# Patient Record
Sex: Female | Born: 1935 | Race: White | Hispanic: No | Smoking: Former smoker
Health system: Southern US, Community
[De-identification: ages and names within clinical notes are randomized; demographics above are authoritative.]

---

## 1999-11-08 ENCOUNTER — Other Ambulatory Visit: Admission: RE | Admit: 1999-11-08 | Discharge: 1999-11-08 | Payer: Self-pay | Admitting: Internal Medicine

## 2008-05-12 ENCOUNTER — Emergency Department (HOSPITAL_COMMUNITY): Admission: EM | Admit: 2008-05-12 | Discharge: 2008-05-12 | Payer: Self-pay | Admitting: *Deleted

## 2010-03-18 ENCOUNTER — Inpatient Hospital Stay (HOSPITAL_COMMUNITY): Admission: RE | Admit: 2010-03-18 | Discharge: 2010-03-21 | Payer: Self-pay | Admitting: Orthopaedic Surgery

## 2010-07-02 LAB — CBC
HCT: 38.4 % (ref 36.0–46.0)
Hemoglobin: 12.8 g/dL (ref 12.0–15.0)
Hemoglobin: 9.6 g/dL — ABNORMAL LOW (ref 12.0–15.0)
MCV: 89.1 fL (ref 78.0–100.0)
Platelets: 245 10*3/uL (ref 150–400)
Platelets: 253 10*3/uL (ref 150–400)
RBC: 3.29 MIL/uL — ABNORMAL LOW (ref 3.87–5.11)
RBC: 3.31 MIL/uL — ABNORMAL LOW (ref 3.87–5.11)
RDW: 13 % (ref 11.5–15.5)
WBC: 7.3 10*3/uL (ref 4.0–10.5)
WBC: 7.8 10*3/uL (ref 4.0–10.5)
WBC: 9.5 10*3/uL (ref 4.0–10.5)

## 2010-07-02 LAB — BASIC METABOLIC PANEL
Calcium: 8.7 mg/dL (ref 8.4–10.5)
Chloride: 106 mEq/L (ref 96–112)
Creatinine, Ser: 0.82 mg/dL (ref 0.4–1.2)
Creatinine, Ser: 0.93 mg/dL (ref 0.4–1.2)
GFR calc Af Amer: >60 mL/min (ref >60)
GFR calc Af Amer: >60 mL/min (ref >60)
GFR calc non Af Amer: 59 mL/min — ABNORMAL LOW (ref >60)
GFR calc non Af Amer: >60 mL/min (ref >60)
Sodium: 137 mEq/L (ref 135–145)

## 2010-07-02 LAB — URINALYSIS, ROUTINE W REFLEX MICROSCOPIC
Glucose, UA: NEGATIVE mg/dL
Hgb urine dipstick: NEGATIVE
Ketones, ur: NEGATIVE mg/dL
Protein, ur: NEGATIVE mg/dL
pH: 6 (ref 5.0–8.0)

## 2010-07-02 LAB — DIFFERENTIAL
Basophils Relative: 0 % (ref 0–1)
Eosinophils Absolute: 0.2 10*3/uL (ref 0.0–0.7)
Neutrophils Relative %: 61 % (ref 43–77)

## 2010-07-02 LAB — COMPREHENSIVE METABOLIC PANEL
ALT: 17 U/L (ref 0–35)
Alkaline Phosphatase: 84 U/L (ref 39–117)
CO2: 29 mEq/L (ref 19–32)
Chloride: 106 mEq/L (ref 96–112)
GFR calc non Af Amer: 58 mL/min — ABNORMAL LOW (ref >60)
Glucose, Bld: 97 mg/dL (ref 70–99)
Potassium: 3.9 mEq/L (ref 3.5–5.1)
Sodium: 141 mEq/L (ref 135–145)

## 2010-07-02 LAB — PROTIME-INR
INR: 0.92 (ref 0.00–1.49)
INR: 1.1 (ref 0.00–1.49)
INR: 1.34 (ref 0.00–1.49)
INR: 1.61 — ABNORMAL HIGH (ref 0.00–1.49)
Prothrombin Time: 12.6 seconds (ref 11.6–15.2)
Prothrombin Time: 14.4 seconds (ref 11.6–15.2)
Prothrombin Time: 16.8 seconds — ABNORMAL HIGH (ref 11.6–15.2)

## 2010-07-02 LAB — TYPE AND SCREEN: ABO/RH(D): A POS

## 2010-07-02 LAB — ABO/RH: ABO/RH(D): A POS

## 2010-07-05 ENCOUNTER — Encounter (HOSPITAL_COMMUNITY)
Admission: RE | Admit: 2010-07-05 | Discharge: 2010-07-05 | Disposition: A | Discharge status: Home / Self Care | Payer: Medicare Other | Source: Ambulatory Visit | Attending: Orthopaedic Surgery | Admitting: Orthopaedic Surgery

## 2010-07-05 LAB — URINE MICROSCOPIC-ADD ON

## 2010-07-05 LAB — URINALYSIS, ROUTINE W REFLEX MICROSCOPIC
Glucose, UA: NEGATIVE mg/dL
Hgb urine dipstick: NEGATIVE
Protein, ur: NEGATIVE mg/dL
Specific Gravity, Urine: 1.008 (ref 1.005–1.030)
Urobilinogen, UA: 0.2 mg/dL (ref 0.0–1.0)

## 2010-07-05 LAB — DIFFERENTIAL
Basophils Absolute: 0 10*3/uL (ref 0.0–0.1)
Basophils Relative: 0 % (ref 0–1)
Lymphocytes Relative: 31 % (ref 12–46)
Monocytes Absolute: 0.5 10*3/uL (ref 0.1–1.0)
Neutro Abs: 4.8 10*3/uL (ref 1.7–7.7)

## 2010-07-05 LAB — COMPREHENSIVE METABOLIC PANEL
CO2: 28 mEq/L (ref 19–32)
Calcium: 9.8 mg/dL (ref 8.4–10.5)
Creatinine, Ser: 1.02 mg/dL (ref 0.4–1.2)
GFR calc non Af Amer: 53 mL/min — ABNORMAL LOW (ref >60)
Glucose, Bld: 97 mg/dL (ref 70–99)
Sodium: 139 mEq/L (ref 135–145)
Total Protein: 7 g/dL (ref 6.0–8.3)

## 2010-07-05 LAB — CBC
HCT: 38.8 % (ref 36.0–46.0)
Hemoglobin: 12.8 g/dL (ref 12.0–15.0)
MCHC: 33 g/dL (ref 30.0–36.0)
RDW: 13.3 % (ref 11.5–15.5)
WBC: 7.9 10*3/uL (ref 4.0–10.5)

## 2010-07-05 LAB — SURGICAL PCR SCREEN
MRSA, PCR: NEGATIVE
Staphylococcus aureus: NEGATIVE

## 2010-07-08 ENCOUNTER — Inpatient Hospital Stay (HOSPITAL_COMMUNITY): Payer: Medicare Other

## 2010-07-08 ENCOUNTER — Inpatient Hospital Stay (HOSPITAL_COMMUNITY)
Admission: RE | Admit: 2010-07-08 | Discharge: 2010-07-11 | DRG: 470 | Disposition: A | Discharge status: Home Health | Payer: Medicare Other | Source: Ambulatory Visit | Attending: Orthopaedic Surgery | Admitting: Orthopaedic Surgery

## 2010-07-08 DIAGNOSIS — M1710 Unilateral primary osteoarthritis, unspecified knee: Principal | ICD-10-CM | POA: Diagnosis present

## 2010-07-08 DIAGNOSIS — I10 Essential (primary) hypertension: Secondary | ICD-10-CM | POA: Diagnosis present

## 2010-07-08 LAB — TYPE AND SCREEN: ABO/RH(D): A POS

## 2010-07-08 LAB — PROTIME-INR
INR: 1 (ref 0.00–1.49)
Prothrombin Time: 13.4 seconds (ref 11.6–15.2)

## 2010-07-09 LAB — CBC
HCT: 30.3 % — ABNORMAL LOW (ref 36.0–46.0)
MCV: 83.9 fL (ref 78.0–100.0)
Platelets: 269 10*3/uL (ref 150–400)
RBC: 3.61 MIL/uL — ABNORMAL LOW (ref 3.87–5.11)
WBC: 10 10*3/uL (ref 4.0–10.5)

## 2010-07-09 LAB — BASIC METABOLIC PANEL
BUN: 15 mg/dL (ref 6–23)
Chloride: 104 mEq/L (ref 96–112)
Potassium: 4.1 mEq/L (ref 3.5–5.1)

## 2010-07-10 LAB — BASIC METABOLIC PANEL
CO2: 25 mEq/L (ref 19–32)
Chloride: 109 mEq/L (ref 96–112)
GFR calc Af Amer: >60 mL/min (ref >60)
Sodium: 139 mEq/L (ref 135–145)

## 2010-07-10 LAB — CBC
HCT: 25.2 % — ABNORMAL LOW (ref 36.0–46.0)
MCV: 84.6 fL (ref 78.0–100.0)
RBC: 2.98 MIL/uL — ABNORMAL LOW (ref 3.87–5.11)
RDW: 13.8 % (ref 11.5–15.5)
WBC: 11.2 10*3/uL — ABNORMAL HIGH (ref 4.0–10.5)

## 2010-07-11 LAB — HEMOGLOBIN AND HEMATOCRIT, BLOOD: HCT: 27.8 % — ABNORMAL LOW (ref 36.0–46.0)

## 2010-07-11 LAB — PROTIME-INR
INR: 1.47 (ref 0.00–1.49)
Prothrombin Time: 18 seconds — ABNORMAL HIGH (ref 11.6–15.2)

## 2010-07-16 NOTE — Op Note (Signed)
[name], [name]               ACCOUNT NO.:  [id number]  MEDICAL RECORD NO.:  [id number]           PATIENT TYPE:  I  LOCATION:  5014                         FACILITY:  [facility]  PHYSICIAN:  Arnol Mcgibbon C. Ophelia Charter, M.D.    DATE OF BIRTH:  01-27-1932  DATE OF PROCEDURE:  07/08/2010 DATE OF DISCHARGE:                              OPERATIVE REPORT   PREOPERATIVE DIAGNOSIS:  Left knee osteoarthritis.  POSTOPERATIVE DIAGNOSIS:  Left knee osteoarthritis.  PROCEDURE:  Left cemented total knee arthroplasty, computer assisted.  SURGEON:  Arhum Peeples C. Ophelia Charter, MD.  ASSISTANT:  Maud Deed, PA-C, present for the entire procedure and medically necessary.  ANESTHESIA:  GOT plus Marcaine local and preoperative femoral nerve block.  COMPONENTS USED:  DePuy, rotating platform, [employer], cemented, #3 femur with lugs, #3 tibia, and 10-mm spacer, 35-mm all-poly 3-peg patella.  DESCRIPTION OF PROCEDURE:  After induction of general anesthesia, standard prepping and draping, preoperative Ancef prophylaxis, proximal thigh tourniquet, sterile skin marker for anterior midline incision, and Betadine Steri-Drape application.  Time-out procedure was completed. Leg was wrapped in an Esmarch.  Tourniquet inflated.  The patient had a 16-degree flexion contracture and 10-degree varus deformity with severe erosive osteoarthritis with 1-2 cm marginal osteophytes and more medial in the lateral compartment wear and flattening of the femoral condyle, and erosion on the medial tibial plateau.  Medial parapatellar incision was made through the deep retinaculum.  Quad tendon was split between the medial one-third and lateral two-thirds of the quad tendon.  There was some chronic synovitis changes, no purulence.  Large 1-2 cm spurs removed from opposites of the femur.  There was significant overhang of the spurs, eburnated bone, erosive wear.  There was actually some menisci fragments left.  The medial meniscus and  lateral meniscus looked relatively normal.  Meniscus was resected.  Pins were placed in the femur for computer assist.  Half bicortical pins were placed in the femur inside the incision.  Stab incision was made spreading with tonsil clamp and then placement of tibial pins in the mid tibia.  Connectors were attached, tightened down securely, and appropriately adjusted, and knee flexion showed 16.4-degree flexion contracture and 9-degree varus deformity.  Hip rotation was marked followed by degeneration of the tibial model then the femoral model.  A #3 size was recommended on the femur.  This was identical to the opposite side and 10-mm were cut off the femur.  Chamfer cuts were made.  Box cut was made after the tibia was cut.  Tibia cut had significantly more bone removed lateral to medial to the erosive medial wear and 11 mm was taken off the tibia. Some MCL release had been made off the tibia, deep fibers, and large posterior spurs removed off the back of the femur, and the posterior capsule was stripped to allow full extension.  Keel preparation was made in the tibia followed by insertion of trials.  Additional posterior capsule release had been made and spurs removed off the back of the femur, three-quarter curved osteotome.  This gave excellent correction. The knee came out fully straight, less than a millimeter of  difference in balance, medial and lateral, and slightly more medial release was made.  Good collateral balance of flexion and extension.  Pulsatile lavage while the cement was vacuum mixed.  Tibia was cemented first followed by cementing the femur.  Patella had been prepared at the end of the case, removing 10 mm of bone from facet-to-facet, and then drilling for 35 peg hole patella.  Tibia cemented first followed by cementing the femur, removing all excessive cement, insertion of 10-mm all-poly spacer, and then the patella was cemented.  Cement was hard at 15 minutes.   Once cement was hard, knee was taken through full range of motion for final checking, balance, alignment, and everything looked good.  The computer pins were removed.  Repeat irrigation, dropping the tourniquet, hemostasis obtained, and then standard layered closure. Deep layer, nonabsorbable, 0 Vicryl, and superficial retinaculum 2-0 and subcutaneous tissue and subcuticular skin closure.  Postop dressing. Knee immobilizer applied.  The patient tolerated the procedure well and transferred to recovery room in stable condition.     Amiel Mccaffrey C. Ophelia Charter, M.D.     MCY/MEDQ  D:  07/08/2010  T:  07/09/2010  Job:  [id number]  Electronically Signed by Annell Greening M.D. on 07/16/2010 06:01:27 PM

## 2010-08-05 LAB — CBC
RBC: 4.21 MIL/uL (ref 3.87–5.11)
WBC: 7.8 10*3/uL (ref 4.0–10.5)

## 2010-08-08 NOTE — Discharge Summary (Signed)
[name], [name]               ACCOUNT NO.:  [id number]  MEDICAL RECORD NO.:  [id number]           PATIENT TYPE:  I  LOCATION:  5014                         FACILITY:  [facility]  PHYSICIAN:  Evaan Tidwell C. Ophelia Charter, M.D.    DATE OF BIRTH:  May 22, 1931  DATE OF ADMISSION:  07/08/2010 DATE OF DISCHARGE:  07/11/2010                              DISCHARGE SUMMARY   ADMISSION DIAGNOSES: 1. Left knee osteoarthritis. 2. Hypertension. 3. Cataracts.  DISCHARGE DIAGNOSES: 1. Left knee osteoarthritis. 2. Hypertension. 3. Cataracts.  PROCEDURE:  On July 08, 2010, the patient underwent left cemented total knee arthroplasty, computer assisted performed by Dr. Ophelia Charter, assisted by Maud Deed, PA-C under general anesthesia.  CONSULTATIONS:  None.  BRIEF HISTORY:  The patient is a 77 year old female with left knee osteoarthritis that has failed conservative treatment.  It was felt she would benefit from surgical intervention and was admitted for the procedure as stated above.  BRIEF HOSPITAL COURSE:  The patient tolerated the procedure under general anesthesia without complications.  Postoperatively, neurovascular motor function of the lower extremities was noted to be intact.  The patient was treated with IV analgesics initially and weaned to p.o. analgesics without difficulty.  Dressing changes were done daily, and her wound was healing well.  Physical therapy was initiated for ambulation and gait training, range of motion stretching, and strengthening exercises.  CPM was utilized.  The patient was able to ambulate with the physical therapist in the hallway prior to discharge, and she was independent with activities of daily living.  The patient was on Coumadin for DVT prophylaxis.  Adjustments in Coumadin dose made according to daily pro times.  On July 11, 2010, she was stable for discharge to her home.  INR at discharge 1.47, hemoglobin, hematocrit 9.2 and 27.8.  She was afebrile, and  vital signs were stable.  The patient was taking regular diet at the time of discharge.  PLAN:  The patient was discharged to her home after arrangements made for advanced home care to provide home health physical therapy as well as durable medical equipment, and Coumadin management.  She will continue to be weightbearing as tolerated.  She will continue with range of motion stretching and strengthening exercises at home.  MEDICATIONS AT DISCHARGE: 1. Coumadin daily for 4 weeks. 2. Robaxin 500 mg 1 every 6-8 hours as needed for spasm. 3. Percocet 5/325 one every 4-6 hours as needed for pain. 4. She will continue on her usual medications including amlodipine,     losartan, metoprolol, and multivitamin.  She has been instructed to     stop her enteric-coated aspirin.   The patient will follow up with     Dr. Ophelia Charter in 2 weeks from the date of surgery.  She will change her     dressing daily or as needed.  She will be allowed to shower on the     fourth postoperative day if there is no wound drainage.  CPM will     be utilized at home for passive range of motion.  She is advised to     call the office if  she has questions or concerns prior to return     office visit.  CONDITION ON DISCHARGE:  Stable.     Wende Neighbors, P.A.   ______________________________ Veverly Fells Ophelia Charter, M.D.    SMV/MEDQ  D:  08/06/2010  T:  08/07/2010  Job:  [id number]  Electronically Signed by Maud Deed P.A. on 08/07/2010 09:42:26 AM Electronically Signed by Annell Greening M.D. on 08/08/2010 01:13:38 PM

## 2011-04-07 IMAGING — CR DG CHEST 2V
2 series · 2 of 2 positions shown · non-contrast
Comparison: None.

CLINICAL DATA: Preoperative film.

CHEST - 2 VIEW

[view not recorded (1 of 2)]
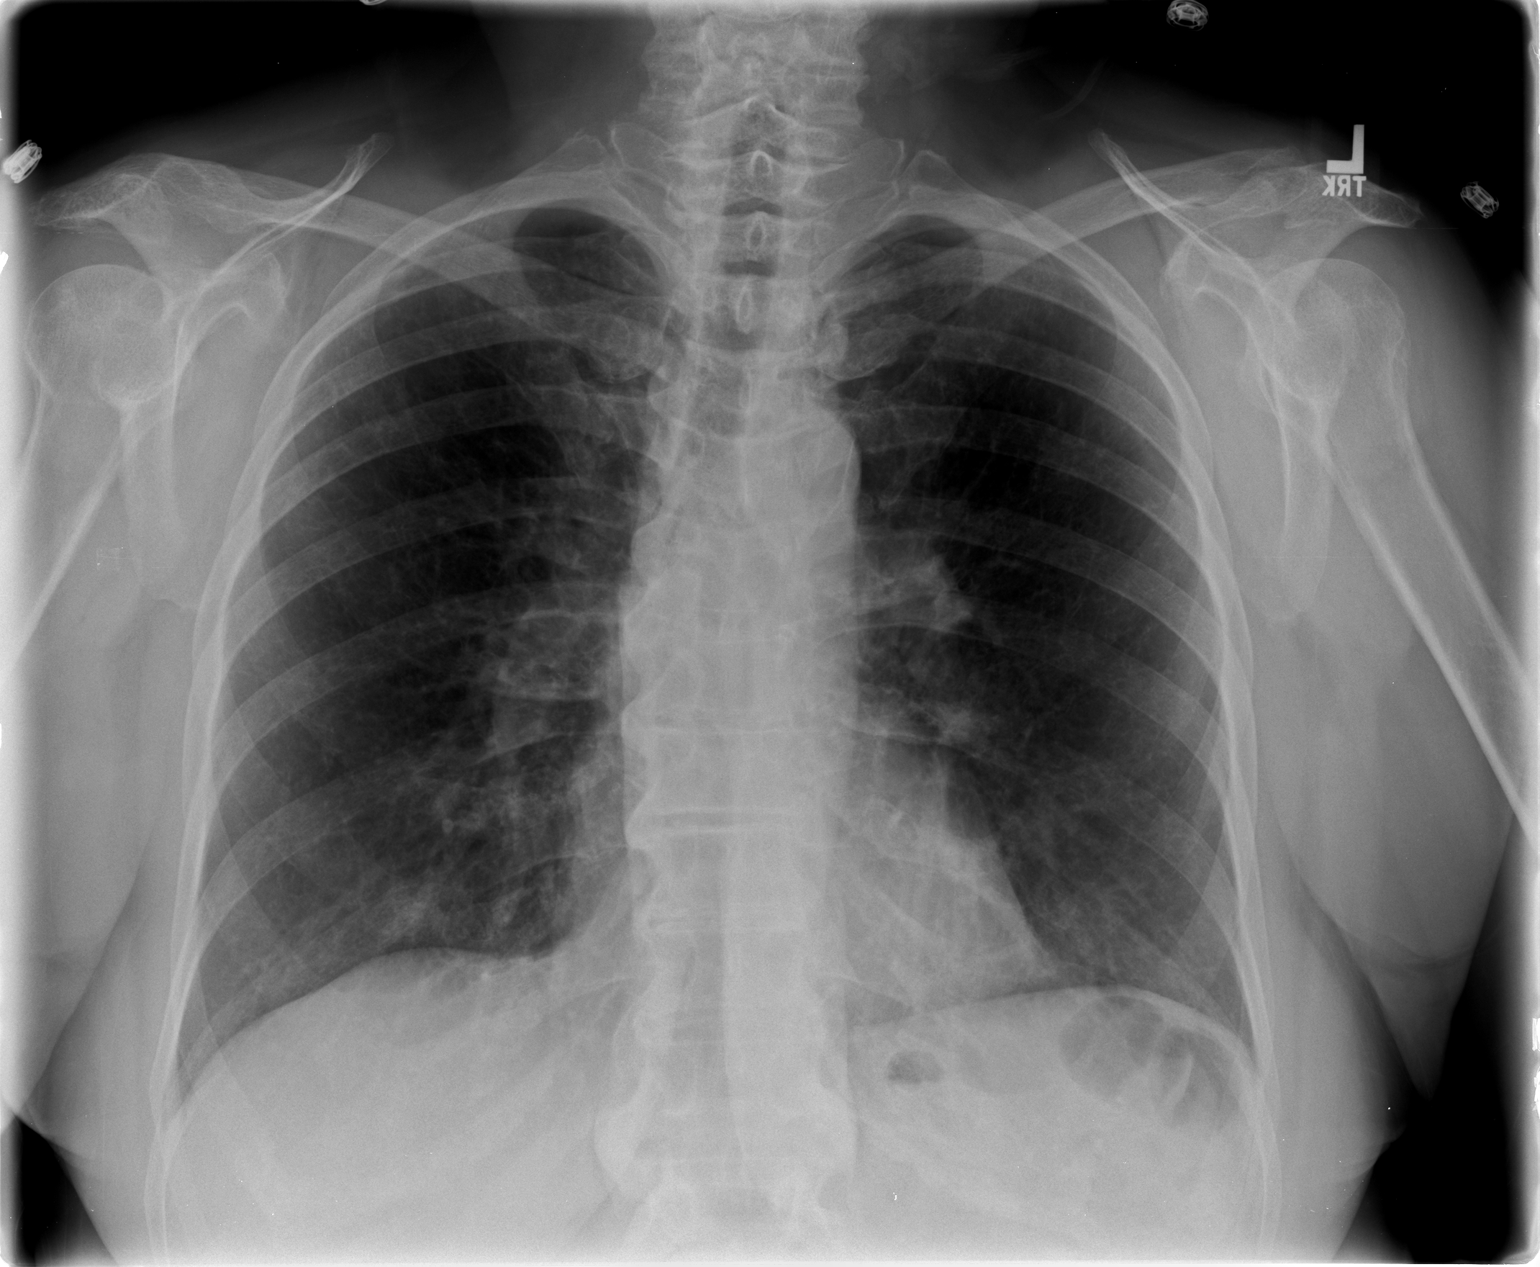

[view not recorded (2 of 2)]
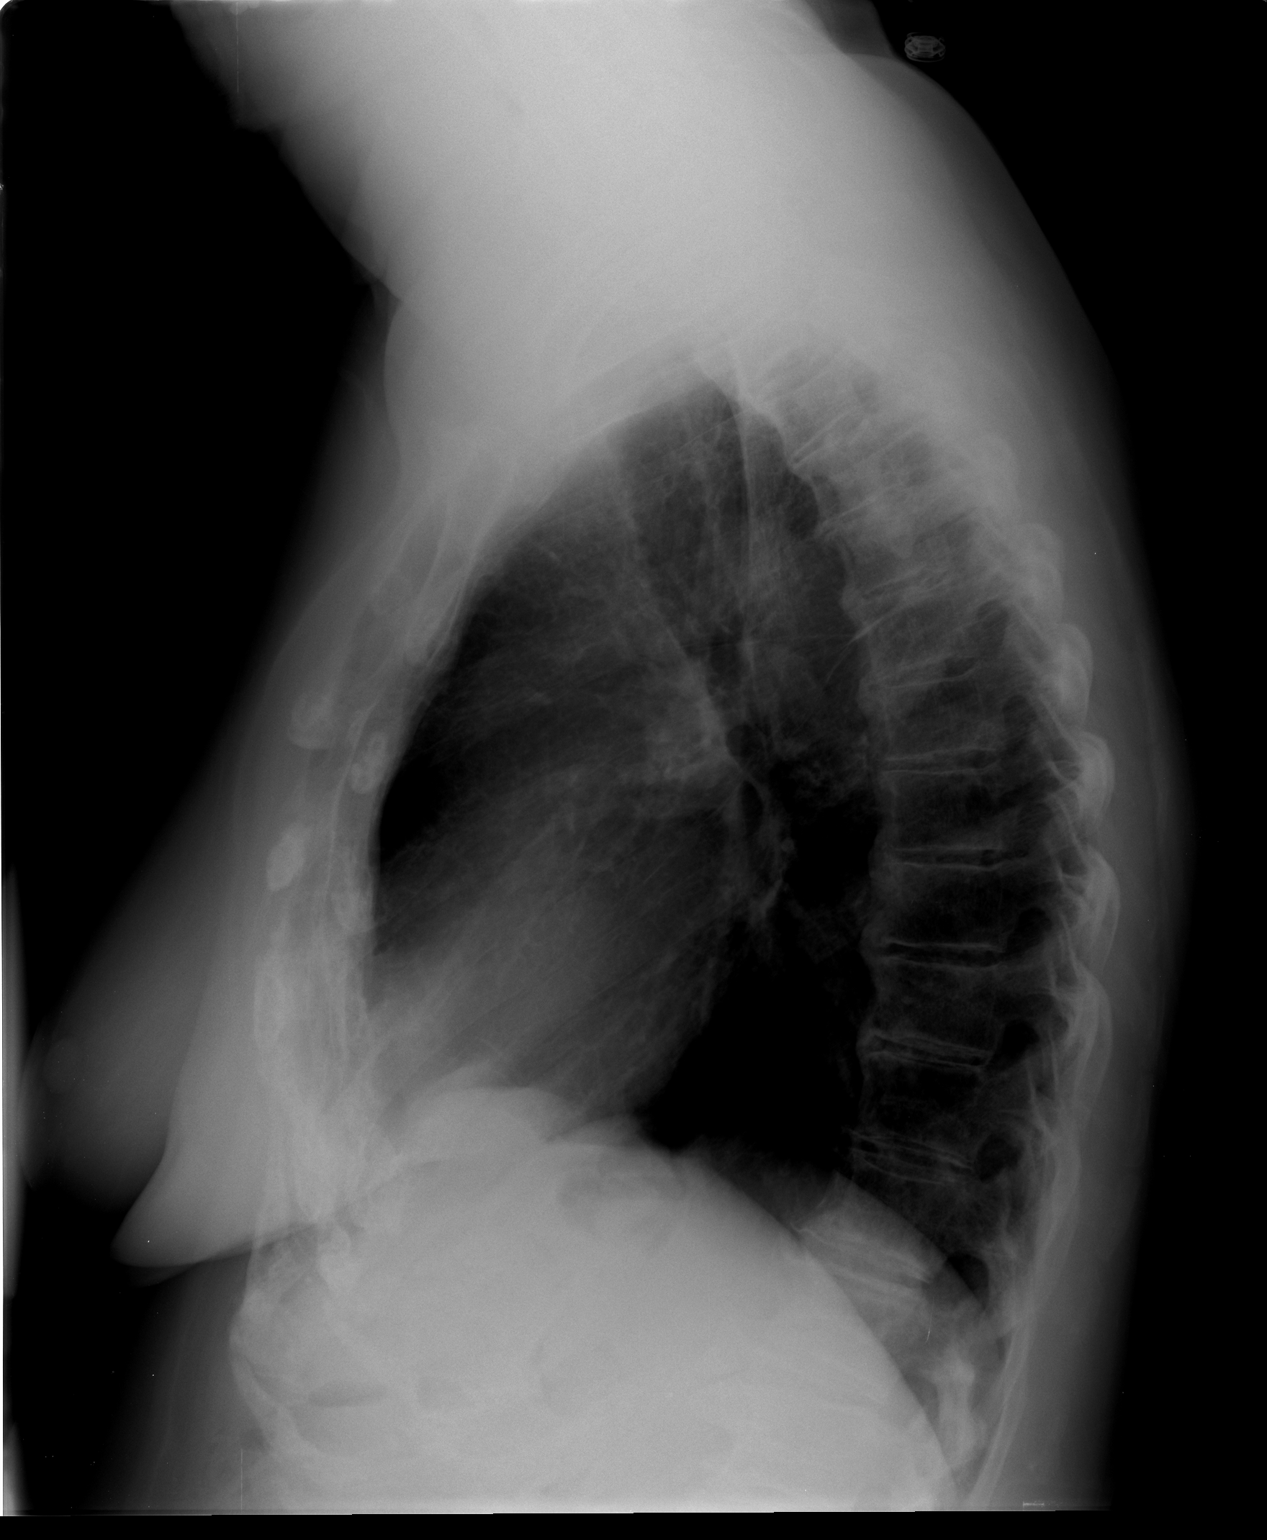

[2 of 2 positions shown; findings below may reference images not displayed]

FINDINGS: Lungs are clear.  Heart size is normal.  No pleural
effusion or pneumothorax.
IMPRESSION: No acute disease.

## 2011-06-25 DIAGNOSIS — Z961 Presence of intraocular lens: Secondary | ICD-10-CM | POA: Diagnosis not present

## 2011-06-25 DIAGNOSIS — H209 Unspecified iridocyclitis: Secondary | ICD-10-CM | POA: Diagnosis not present

## 2011-07-15 DIAGNOSIS — H3530 Unspecified macular degeneration: Secondary | ICD-10-CM | POA: Diagnosis not present

## 2011-07-15 DIAGNOSIS — H2010 Chronic iridocyclitis, unspecified eye: Secondary | ICD-10-CM | POA: Diagnosis not present

## 2011-07-22 DIAGNOSIS — E785 Hyperlipidemia, unspecified: Secondary | ICD-10-CM | POA: Diagnosis not present

## 2011-07-22 DIAGNOSIS — I10 Essential (primary) hypertension: Secondary | ICD-10-CM | POA: Diagnosis not present

## 2011-07-22 DIAGNOSIS — R82998 Other abnormal findings in urine: Secondary | ICD-10-CM | POA: Diagnosis not present

## 2011-07-28 DIAGNOSIS — I10 Essential (primary) hypertension: Secondary | ICD-10-CM | POA: Diagnosis not present

## 2011-07-28 DIAGNOSIS — Z79899 Other long term (current) drug therapy: Secondary | ICD-10-CM | POA: Diagnosis not present

## 2011-07-28 DIAGNOSIS — Z0000 Encounter for general adult medical examination without abnormal findings: Secondary | ICD-10-CM | POA: Diagnosis not present

## 2011-07-28 DIAGNOSIS — E785 Hyperlipidemia, unspecified: Secondary | ICD-10-CM | POA: Diagnosis not present

## 2011-07-30 DIAGNOSIS — Z1212 Encounter for screening for malignant neoplasm of rectum: Secondary | ICD-10-CM | POA: Diagnosis not present

## 2011-07-31 DIAGNOSIS — H3530 Unspecified macular degeneration: Secondary | ICD-10-CM | POA: Diagnosis not present

## 2011-07-31 DIAGNOSIS — H2010 Chronic iridocyclitis, unspecified eye: Secondary | ICD-10-CM | POA: Diagnosis not present

## 2011-08-03 IMAGING — CR DG KNEE 1-2V PORT*L*
2 series · 2 of 2 positions shown · non-contrast
Comparison: None

CLINICAL DATA: Left knee arthroplasty.

PORTABLE LEFT KNEE - 1-2 VIEW

[view not recorded (1 of 2)]
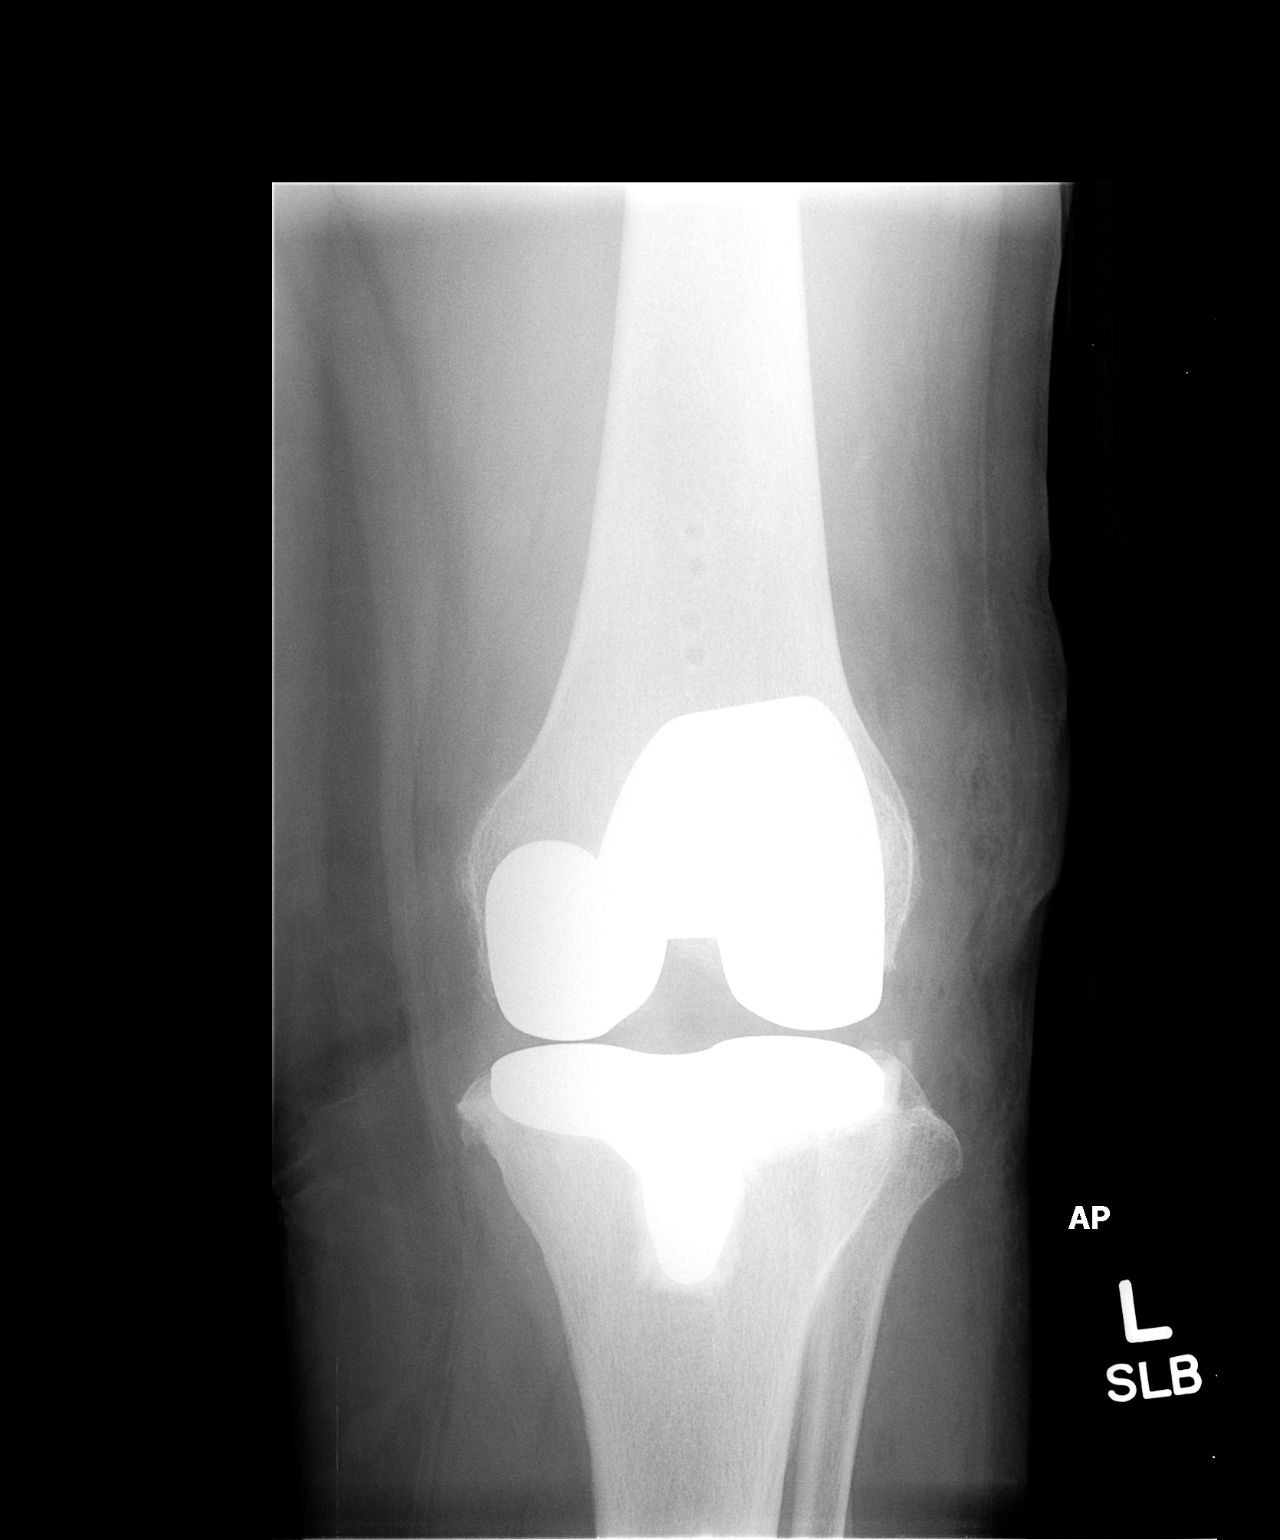

[view not recorded (2 of 2)]
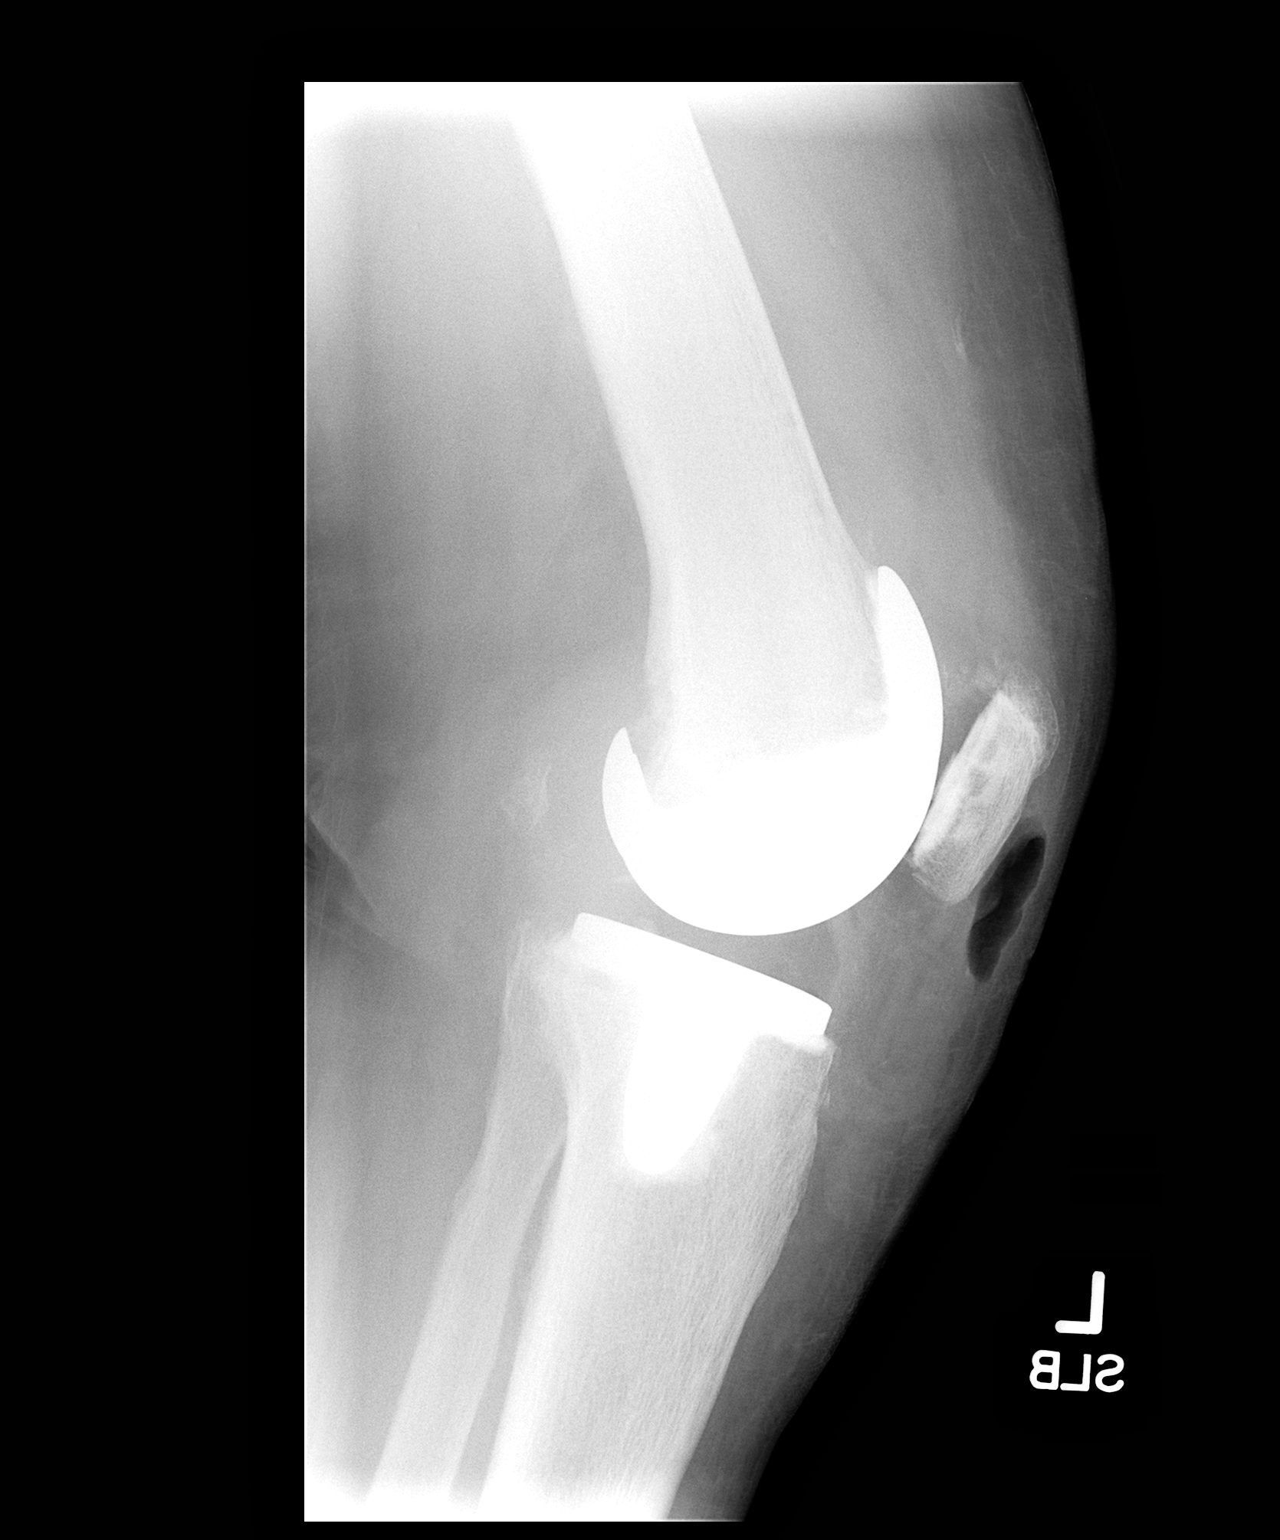

[2 of 2 positions shown; findings below may reference images not displayed]

FINDINGS: There are well seated components of a total left knee
arthroplasty.  No complicating features are identified.
IMPRESSION: Well seated components of a total left knee arthroplasty.

## 2011-08-12 DIAGNOSIS — H4320 Crystalline deposits in vitreous body, unspecified eye: Secondary | ICD-10-CM | POA: Diagnosis not present

## 2011-08-12 DIAGNOSIS — H2010 Chronic iridocyclitis, unspecified eye: Secondary | ICD-10-CM | POA: Diagnosis not present

## 2012-02-10 DIAGNOSIS — H3530 Unspecified macular degeneration: Secondary | ICD-10-CM | POA: Diagnosis not present

## 2012-02-10 DIAGNOSIS — H209 Unspecified iridocyclitis: Secondary | ICD-10-CM | POA: Diagnosis not present

## 2012-02-10 DIAGNOSIS — H4320 Crystalline deposits in vitreous body, unspecified eye: Secondary | ICD-10-CM | POA: Diagnosis not present

## 2012-02-10 DIAGNOSIS — Z961 Presence of intraocular lens: Secondary | ICD-10-CM | POA: Diagnosis not present

## 2012-02-10 DIAGNOSIS — H2010 Chronic iridocyclitis, unspecified eye: Secondary | ICD-10-CM | POA: Diagnosis not present

## 2012-07-23 DIAGNOSIS — E785 Hyperlipidemia, unspecified: Secondary | ICD-10-CM | POA: Diagnosis not present

## 2012-07-23 DIAGNOSIS — I10 Essential (primary) hypertension: Secondary | ICD-10-CM | POA: Diagnosis not present

## 2012-07-23 DIAGNOSIS — R82998 Other abnormal findings in urine: Secondary | ICD-10-CM | POA: Diagnosis not present

## 2012-08-11 DIAGNOSIS — Z1331 Encounter for screening for depression: Secondary | ICD-10-CM | POA: Diagnosis not present

## 2012-08-11 DIAGNOSIS — H3530 Unspecified macular degeneration: Secondary | ICD-10-CM | POA: Diagnosis not present

## 2012-08-11 DIAGNOSIS — E669 Obesity, unspecified: Secondary | ICD-10-CM | POA: Diagnosis not present

## 2012-08-11 DIAGNOSIS — H6120 Impacted cerumen, unspecified ear: Secondary | ICD-10-CM | POA: Diagnosis not present

## 2012-08-11 DIAGNOSIS — Z0000 Encounter for general adult medical examination without abnormal findings: Secondary | ICD-10-CM | POA: Diagnosis not present

## 2012-08-11 DIAGNOSIS — Z6833 Body mass index (BMI) 33.0-33.9, adult: Secondary | ICD-10-CM | POA: Diagnosis not present

## 2012-08-11 DIAGNOSIS — I10 Essential (primary) hypertension: Secondary | ICD-10-CM | POA: Diagnosis not present

## 2012-08-11 DIAGNOSIS — E785 Hyperlipidemia, unspecified: Secondary | ICD-10-CM | POA: Diagnosis not present

## 2012-08-17 DIAGNOSIS — H3530 Unspecified macular degeneration: Secondary | ICD-10-CM | POA: Diagnosis not present

## 2012-08-17 DIAGNOSIS — H4320 Crystalline deposits in vitreous body, unspecified eye: Secondary | ICD-10-CM | POA: Diagnosis not present

## 2012-08-17 DIAGNOSIS — H209 Unspecified iridocyclitis: Secondary | ICD-10-CM | POA: Diagnosis not present

## 2012-08-17 DIAGNOSIS — H2010 Chronic iridocyclitis, unspecified eye: Secondary | ICD-10-CM | POA: Diagnosis not present

## 2012-08-23 DIAGNOSIS — H209 Unspecified iridocyclitis: Secondary | ICD-10-CM | POA: Diagnosis not present

## 2013-03-15 DIAGNOSIS — H209 Unspecified iridocyclitis: Secondary | ICD-10-CM | POA: Diagnosis not present

## 2013-03-15 DIAGNOSIS — H3530 Unspecified macular degeneration: Secondary | ICD-10-CM | POA: Diagnosis not present

## 2013-03-15 DIAGNOSIS — H2010 Chronic iridocyclitis, unspecified eye: Secondary | ICD-10-CM | POA: Diagnosis not present

## 2013-03-15 DIAGNOSIS — Z961 Presence of intraocular lens: Secondary | ICD-10-CM | POA: Diagnosis not present

## 2013-08-10 DIAGNOSIS — E785 Hyperlipidemia, unspecified: Secondary | ICD-10-CM | POA: Diagnosis not present

## 2013-08-10 DIAGNOSIS — R82998 Other abnormal findings in urine: Secondary | ICD-10-CM | POA: Diagnosis not present

## 2013-08-10 DIAGNOSIS — I10 Essential (primary) hypertension: Secondary | ICD-10-CM | POA: Diagnosis not present

## 2013-09-02 DIAGNOSIS — Z1212 Encounter for screening for malignant neoplasm of rectum: Secondary | ICD-10-CM | POA: Diagnosis not present

## 2013-09-02 DIAGNOSIS — H3530 Unspecified macular degeneration: Secondary | ICD-10-CM | POA: Diagnosis not present

## 2013-09-02 DIAGNOSIS — N183 Chronic kidney disease, stage 3 unspecified: Secondary | ICD-10-CM | POA: Diagnosis not present

## 2013-09-02 DIAGNOSIS — E669 Obesity, unspecified: Secondary | ICD-10-CM | POA: Diagnosis not present

## 2013-09-02 DIAGNOSIS — M25519 Pain in unspecified shoulder: Secondary | ICD-10-CM | POA: Diagnosis not present

## 2013-09-02 DIAGNOSIS — Z0000 Encounter for general adult medical examination without abnormal findings: Secondary | ICD-10-CM | POA: Diagnosis not present

## 2013-09-02 DIAGNOSIS — I10 Essential (primary) hypertension: Secondary | ICD-10-CM | POA: Diagnosis not present

## 2013-09-02 DIAGNOSIS — Z1331 Encounter for screening for depression: Secondary | ICD-10-CM | POA: Diagnosis not present

## 2013-09-02 DIAGNOSIS — E785 Hyperlipidemia, unspecified: Secondary | ICD-10-CM | POA: Diagnosis not present

## 2013-09-02 DIAGNOSIS — L989 Disorder of the skin and subcutaneous tissue, unspecified: Secondary | ICD-10-CM | POA: Diagnosis not present

## 2013-09-20 DIAGNOSIS — H4320 Crystalline deposits in vitreous body, unspecified eye: Secondary | ICD-10-CM | POA: Diagnosis not present

## 2013-09-20 DIAGNOSIS — H209 Unspecified iridocyclitis: Secondary | ICD-10-CM | POA: Diagnosis not present

## 2013-09-20 DIAGNOSIS — H3530 Unspecified macular degeneration: Secondary | ICD-10-CM | POA: Diagnosis not present

## 2013-09-20 DIAGNOSIS — Z961 Presence of intraocular lens: Secondary | ICD-10-CM | POA: Diagnosis not present

## 2013-09-20 DIAGNOSIS — H2010 Chronic iridocyclitis, unspecified eye: Secondary | ICD-10-CM | POA: Diagnosis not present

## 2013-10-06 DIAGNOSIS — L821 Other seborrheic keratosis: Secondary | ICD-10-CM | POA: Diagnosis not present

## 2013-10-06 DIAGNOSIS — L739 Follicular disorder, unspecified: Secondary | ICD-10-CM | POA: Diagnosis not present

## 2013-10-06 DIAGNOSIS — D239 Other benign neoplasm of skin, unspecified: Secondary | ICD-10-CM | POA: Diagnosis not present

## 2013-12-27 DIAGNOSIS — H3530 Unspecified macular degeneration: Secondary | ICD-10-CM | POA: Diagnosis not present

## 2013-12-27 DIAGNOSIS — H209 Unspecified iridocyclitis: Secondary | ICD-10-CM | POA: Diagnosis not present

## 2013-12-27 DIAGNOSIS — H4320 Crystalline deposits in vitreous body, unspecified eye: Secondary | ICD-10-CM | POA: Diagnosis not present

## 2013-12-27 DIAGNOSIS — H2010 Chronic iridocyclitis, unspecified eye: Secondary | ICD-10-CM | POA: Diagnosis not present

## 2013-12-27 DIAGNOSIS — Z961 Presence of intraocular lens: Secondary | ICD-10-CM | POA: Diagnosis not present

## 2014-03-24 DIAGNOSIS — H209 Unspecified iridocyclitis: Secondary | ICD-10-CM | POA: Diagnosis not present

## 2014-03-24 DIAGNOSIS — Z961 Presence of intraocular lens: Secondary | ICD-10-CM | POA: Diagnosis not present

## 2014-03-24 DIAGNOSIS — H2013 Chronic iridocyclitis, bilateral: Secondary | ICD-10-CM | POA: Diagnosis not present

## 2014-03-24 DIAGNOSIS — H3530 Unspecified macular degeneration: Secondary | ICD-10-CM | POA: Diagnosis not present

## 2014-08-29 DIAGNOSIS — N390 Urinary tract infection, site not specified: Secondary | ICD-10-CM | POA: Diagnosis not present

## 2014-08-29 DIAGNOSIS — N183 Chronic kidney disease, stage 3 (moderate): Secondary | ICD-10-CM | POA: Diagnosis not present

## 2014-08-29 DIAGNOSIS — E785 Hyperlipidemia, unspecified: Secondary | ICD-10-CM | POA: Diagnosis not present

## 2014-08-29 DIAGNOSIS — I10 Essential (primary) hypertension: Secondary | ICD-10-CM | POA: Diagnosis not present

## 2014-08-29 DIAGNOSIS — R8299 Other abnormal findings in urine: Secondary | ICD-10-CM | POA: Diagnosis not present

## 2014-09-06 DIAGNOSIS — Z1389 Encounter for screening for other disorder: Secondary | ICD-10-CM | POA: Diagnosis not present

## 2014-09-06 DIAGNOSIS — E785 Hyperlipidemia, unspecified: Secondary | ICD-10-CM | POA: Diagnosis not present

## 2014-09-06 DIAGNOSIS — Z6833 Body mass index (BMI) 33.0-33.9, adult: Secondary | ICD-10-CM | POA: Diagnosis not present

## 2014-09-06 DIAGNOSIS — N183 Chronic kidney disease, stage 3 (moderate): Secondary | ICD-10-CM | POA: Diagnosis not present

## 2014-09-06 DIAGNOSIS — H3530 Unspecified macular degeneration: Secondary | ICD-10-CM | POA: Diagnosis not present

## 2014-09-06 DIAGNOSIS — I10 Essential (primary) hypertension: Secondary | ICD-10-CM | POA: Diagnosis not present

## 2014-09-06 DIAGNOSIS — Z0000 Encounter for general adult medical examination without abnormal findings: Secondary | ICD-10-CM | POA: Diagnosis not present

## 2014-09-06 DIAGNOSIS — M19049 Primary osteoarthritis, unspecified hand: Secondary | ICD-10-CM | POA: Diagnosis not present

## 2014-09-06 DIAGNOSIS — E663 Overweight: Secondary | ICD-10-CM | POA: Diagnosis not present

## 2014-09-08 DIAGNOSIS — Z1212 Encounter for screening for malignant neoplasm of rectum: Secondary | ICD-10-CM | POA: Diagnosis not present

## 2014-10-26 DIAGNOSIS — H2013 Chronic iridocyclitis, bilateral: Secondary | ICD-10-CM | POA: Diagnosis not present

## 2014-10-26 DIAGNOSIS — Z961 Presence of intraocular lens: Secondary | ICD-10-CM | POA: Diagnosis not present

## 2015-05-01 DIAGNOSIS — H35033 Hypertensive retinopathy, bilateral: Secondary | ICD-10-CM | POA: Diagnosis not present

## 2015-05-01 DIAGNOSIS — H2012 Chronic iridocyclitis, left eye: Secondary | ICD-10-CM | POA: Diagnosis not present

## 2015-05-01 DIAGNOSIS — Z961 Presence of intraocular lens: Secondary | ICD-10-CM | POA: Diagnosis not present

## 2015-09-04 DIAGNOSIS — N390 Urinary tract infection, site not specified: Secondary | ICD-10-CM | POA: Diagnosis not present

## 2015-09-04 DIAGNOSIS — N183 Chronic kidney disease, stage 3 (moderate): Secondary | ICD-10-CM | POA: Diagnosis not present

## 2015-09-04 DIAGNOSIS — E784 Other hyperlipidemia: Secondary | ICD-10-CM | POA: Diagnosis not present

## 2015-09-04 DIAGNOSIS — R8299 Other abnormal findings in urine: Secondary | ICD-10-CM | POA: Diagnosis not present

## 2015-09-12 DIAGNOSIS — I10 Essential (primary) hypertension: Secondary | ICD-10-CM | POA: Diagnosis not present

## 2015-09-12 DIAGNOSIS — N183 Chronic kidney disease, stage 3 (moderate): Secondary | ICD-10-CM | POA: Diagnosis not present

## 2015-09-12 DIAGNOSIS — M19049 Primary osteoarthritis, unspecified hand: Secondary | ICD-10-CM | POA: Diagnosis not present

## 2015-09-12 DIAGNOSIS — Z6832 Body mass index (BMI) 32.0-32.9, adult: Secondary | ICD-10-CM | POA: Diagnosis not present

## 2015-09-12 DIAGNOSIS — E784 Other hyperlipidemia: Secondary | ICD-10-CM | POA: Diagnosis not present

## 2015-09-12 DIAGNOSIS — E669 Obesity, unspecified: Secondary | ICD-10-CM | POA: Diagnosis not present

## 2015-09-12 DIAGNOSIS — Z0000 Encounter for general adult medical examination without abnormal findings: Secondary | ICD-10-CM | POA: Diagnosis not present

## 2015-09-12 DIAGNOSIS — Z1389 Encounter for screening for other disorder: Secondary | ICD-10-CM | POA: Diagnosis not present

## 2015-11-13 DIAGNOSIS — H35033 Hypertensive retinopathy, bilateral: Secondary | ICD-10-CM | POA: Diagnosis not present

## 2015-11-13 DIAGNOSIS — Z961 Presence of intraocular lens: Secondary | ICD-10-CM | POA: Diagnosis not present

## 2015-11-13 DIAGNOSIS — H2012 Chronic iridocyclitis, left eye: Secondary | ICD-10-CM | POA: Diagnosis not present

## 2016-02-11 DIAGNOSIS — Z23 Encounter for immunization: Secondary | ICD-10-CM | POA: Diagnosis not present

## 2016-07-15 DIAGNOSIS — Z961 Presence of intraocular lens: Secondary | ICD-10-CM | POA: Diagnosis not present

## 2016-07-15 DIAGNOSIS — H35033 Hypertensive retinopathy, bilateral: Secondary | ICD-10-CM | POA: Diagnosis not present

## 2016-07-15 DIAGNOSIS — H2013 Chronic iridocyclitis, bilateral: Secondary | ICD-10-CM | POA: Diagnosis not present

## 2016-09-11 DIAGNOSIS — R8299 Other abnormal findings in urine: Secondary | ICD-10-CM | POA: Diagnosis not present

## 2016-09-11 DIAGNOSIS — N390 Urinary tract infection, site not specified: Secondary | ICD-10-CM | POA: Diagnosis not present

## 2016-09-11 DIAGNOSIS — E784 Other hyperlipidemia: Secondary | ICD-10-CM | POA: Diagnosis not present

## 2016-09-11 DIAGNOSIS — N183 Chronic kidney disease, stage 3 (moderate): Secondary | ICD-10-CM | POA: Diagnosis not present

## 2016-09-18 DIAGNOSIS — Z0000 Encounter for general adult medical examination without abnormal findings: Secondary | ICD-10-CM | POA: Diagnosis not present

## 2016-09-18 DIAGNOSIS — R202 Paresthesia of skin: Secondary | ICD-10-CM | POA: Diagnosis not present

## 2016-09-18 DIAGNOSIS — L989 Disorder of the skin and subcutaneous tissue, unspecified: Secondary | ICD-10-CM | POA: Diagnosis not present

## 2016-09-18 DIAGNOSIS — E668 Other obesity: Secondary | ICD-10-CM | POA: Diagnosis not present

## 2016-09-18 DIAGNOSIS — E784 Other hyperlipidemia: Secondary | ICD-10-CM | POA: Diagnosis not present

## 2016-09-18 DIAGNOSIS — I10 Essential (primary) hypertension: Secondary | ICD-10-CM | POA: Diagnosis not present

## 2016-09-18 DIAGNOSIS — H3530 Unspecified macular degeneration: Secondary | ICD-10-CM | POA: Diagnosis not present

## 2016-09-18 DIAGNOSIS — Z6834 Body mass index (BMI) 34.0-34.9, adult: Secondary | ICD-10-CM | POA: Diagnosis not present

## 2016-09-18 DIAGNOSIS — N183 Chronic kidney disease, stage 3 (moderate): Secondary | ICD-10-CM | POA: Diagnosis not present

## 2016-09-18 DIAGNOSIS — M19049 Primary osteoarthritis, unspecified hand: Secondary | ICD-10-CM | POA: Diagnosis not present

## 2016-09-18 DIAGNOSIS — Z1389 Encounter for screening for other disorder: Secondary | ICD-10-CM | POA: Diagnosis not present

## 2016-11-21 DIAGNOSIS — H26492 Other secondary cataract, left eye: Secondary | ICD-10-CM | POA: Diagnosis not present

## 2017-07-24 DIAGNOSIS — H35033 Hypertensive retinopathy, bilateral: Secondary | ICD-10-CM | POA: Diagnosis not present

## 2017-07-24 DIAGNOSIS — Z961 Presence of intraocular lens: Secondary | ICD-10-CM | POA: Diagnosis not present

## 2017-07-24 DIAGNOSIS — H2013 Chronic iridocyclitis, bilateral: Secondary | ICD-10-CM | POA: Diagnosis not present

## 2017-09-18 DIAGNOSIS — E7849 Other hyperlipidemia: Secondary | ICD-10-CM | POA: Diagnosis not present

## 2017-09-18 DIAGNOSIS — R82998 Other abnormal findings in urine: Secondary | ICD-10-CM | POA: Diagnosis not present

## 2017-09-18 DIAGNOSIS — N183 Chronic kidney disease, stage 3 (moderate): Secondary | ICD-10-CM | POA: Diagnosis not present

## 2017-09-21 DIAGNOSIS — L988 Other specified disorders of the skin and subcutaneous tissue: Secondary | ICD-10-CM | POA: Diagnosis not present

## 2017-09-21 DIAGNOSIS — E7849 Other hyperlipidemia: Secondary | ICD-10-CM | POA: Diagnosis not present

## 2017-09-21 DIAGNOSIS — I10 Essential (primary) hypertension: Secondary | ICD-10-CM | POA: Diagnosis not present

## 2017-09-21 DIAGNOSIS — Z0000 Encounter for general adult medical examination without abnormal findings: Secondary | ICD-10-CM | POA: Diagnosis not present

## 2017-09-21 DIAGNOSIS — R202 Paresthesia of skin: Secondary | ICD-10-CM | POA: Diagnosis not present

## 2017-09-21 DIAGNOSIS — N183 Chronic kidney disease, stage 3 (moderate): Secondary | ICD-10-CM | POA: Diagnosis not present

## 2017-09-21 DIAGNOSIS — Z6833 Body mass index (BMI) 33.0-33.9, adult: Secondary | ICD-10-CM | POA: Diagnosis not present

## 2017-09-21 DIAGNOSIS — Z1389 Encounter for screening for other disorder: Secondary | ICD-10-CM | POA: Diagnosis not present

## 2017-09-21 DIAGNOSIS — H3530 Unspecified macular degeneration: Secondary | ICD-10-CM | POA: Diagnosis not present

## 2017-09-21 DIAGNOSIS — M19049 Primary osteoarthritis, unspecified hand: Secondary | ICD-10-CM | POA: Diagnosis not present

## 2017-10-13 DIAGNOSIS — L570 Actinic keratosis: Secondary | ICD-10-CM | POA: Diagnosis not present

## 2017-10-13 DIAGNOSIS — L718 Other rosacea: Secondary | ICD-10-CM | POA: Diagnosis not present

## 2017-10-13 DIAGNOSIS — L821 Other seborrheic keratosis: Secondary | ICD-10-CM | POA: Diagnosis not present

## 2017-12-22 DIAGNOSIS — L821 Other seborrheic keratosis: Secondary | ICD-10-CM | POA: Diagnosis not present

## 2018-02-09 DIAGNOSIS — Z23 Encounter for immunization: Secondary | ICD-10-CM | POA: Diagnosis not present

## 2018-09-20 DIAGNOSIS — E7849 Other hyperlipidemia: Secondary | ICD-10-CM | POA: Diagnosis not present

## 2018-09-21 DIAGNOSIS — R82998 Other abnormal findings in urine: Secondary | ICD-10-CM | POA: Diagnosis not present

## 2018-09-21 DIAGNOSIS — I10 Essential (primary) hypertension: Secondary | ICD-10-CM | POA: Diagnosis not present

## 2018-09-27 DIAGNOSIS — E669 Obesity, unspecified: Secondary | ICD-10-CM | POA: Diagnosis not present

## 2018-09-27 DIAGNOSIS — Z0000 Encounter for general adult medical examination without abnormal findings: Secondary | ICD-10-CM | POA: Diagnosis not present

## 2018-09-27 DIAGNOSIS — H3530 Unspecified macular degeneration: Secondary | ICD-10-CM | POA: Diagnosis not present

## 2018-09-27 DIAGNOSIS — E785 Hyperlipidemia, unspecified: Secondary | ICD-10-CM | POA: Diagnosis not present

## 2018-09-27 DIAGNOSIS — R202 Paresthesia of skin: Secondary | ICD-10-CM | POA: Diagnosis not present

## 2018-09-27 DIAGNOSIS — N183 Chronic kidney disease, stage 3 (moderate): Secondary | ICD-10-CM | POA: Diagnosis not present

## 2018-09-27 DIAGNOSIS — I129 Hypertensive chronic kidney disease with stage 1 through stage 4 chronic kidney disease, or unspecified chronic kidney disease: Secondary | ICD-10-CM | POA: Diagnosis not present

## 2018-09-27 DIAGNOSIS — Z1331 Encounter for screening for depression: Secondary | ICD-10-CM | POA: Diagnosis not present

## 2018-09-27 DIAGNOSIS — Z1339 Encounter for screening examination for other mental health and behavioral disorders: Secondary | ICD-10-CM | POA: Diagnosis not present

## 2018-10-05 DIAGNOSIS — M79641 Pain in right hand: Secondary | ICD-10-CM | POA: Diagnosis not present

## 2018-10-05 DIAGNOSIS — G5603 Carpal tunnel syndrome, bilateral upper limbs: Secondary | ICD-10-CM | POA: Diagnosis not present

## 2018-10-05 DIAGNOSIS — M79642 Pain in left hand: Secondary | ICD-10-CM | POA: Diagnosis not present

## 2018-12-07 DIAGNOSIS — H35033 Hypertensive retinopathy, bilateral: Secondary | ICD-10-CM | POA: Diagnosis not present

## 2018-12-07 DIAGNOSIS — Z961 Presence of intraocular lens: Secondary | ICD-10-CM | POA: Diagnosis not present

## 2018-12-07 DIAGNOSIS — H2013 Chronic iridocyclitis, bilateral: Secondary | ICD-10-CM | POA: Diagnosis not present

## 2019-01-25 DIAGNOSIS — Z23 Encounter for immunization: Secondary | ICD-10-CM | POA: Diagnosis not present

## 2019-06-19 ENCOUNTER — Ambulatory Visit: Payer: Medicare Other | Attending: Internal Medicine

## 2019-06-19 DIAGNOSIS — Z23 Encounter for immunization: Secondary | ICD-10-CM | POA: Insufficient documentation

## 2019-06-19 NOTE — Progress Notes (Signed)
   Covid-19 Vaccination Clinic  Name:  [name]    MRN: [id number] DOB: 19-Dec-1931  06/19/2019  [name] was observed post Covid-19 immunization for 15 minutes without incidence. She was provided with Vaccine Information Sheet and instruction to access the V-Safe system.   [name] was instructed to call 911 with any severe reactions post vaccine: Marland Kitchen Difficulty breathing  . Swelling of your face and throat  . A fast heartbeat  . A bad rash all over your body  . Dizziness and weakness    Immunizations Administered    Name Date Dose VIS Date Route   Pfizer COVID-19 Vaccine 06/19/2019  3:28 PM 0.3 mL 04/01/2019 Intramuscular   Manufacturer: [employer]   Lot: [id number]   [place]: [id number]

## 2019-07-13 ENCOUNTER — Ambulatory Visit: Payer: Medicare Other | Attending: Internal Medicine

## 2019-07-13 DIAGNOSIS — Z23 Encounter for immunization: Secondary | ICD-10-CM

## 2019-07-13 NOTE — Progress Notes (Signed)
   Covid-19 Vaccination Clinic  Name:  [name]    MRN: [id number] DOB: 1931-05-09  07/13/2019  [name] was observed post Covid-19 immunization for 15 minutes without incident. She was provided with Vaccine Information Sheet and instruction to access the V-Safe system.   [name] was instructed to call 911 with any severe reactions post vaccine: Marland Kitchen Difficulty breathing  . Swelling of face and throat  . A fast heartbeat  . A bad rash all over body  . Dizziness and weakness   Immunizations Administered    Name Date Dose VIS Date Route   Pfizer COVID-19 Vaccine 07/13/2019  8:23 AM 0.3 mL 04/01/2019 Intramuscular   Manufacturer: [employer]   Lot: [id number]   [place]: [id number]

## 2019-09-23 DIAGNOSIS — E7849 Other hyperlipidemia: Secondary | ICD-10-CM | POA: Diagnosis not present

## 2019-09-30 DIAGNOSIS — N1832 Chronic kidney disease, stage 3b: Secondary | ICD-10-CM | POA: Diagnosis not present

## 2019-09-30 DIAGNOSIS — R202 Paresthesia of skin: Secondary | ICD-10-CM | POA: Diagnosis not present

## 2019-09-30 DIAGNOSIS — M19049 Primary osteoarthritis, unspecified hand: Secondary | ICD-10-CM | POA: Diagnosis not present

## 2019-09-30 DIAGNOSIS — I10 Essential (primary) hypertension: Secondary | ICD-10-CM | POA: Diagnosis not present

## 2019-09-30 DIAGNOSIS — R82998 Other abnormal findings in urine: Secondary | ICD-10-CM | POA: Diagnosis not present

## 2019-09-30 DIAGNOSIS — I129 Hypertensive chronic kidney disease with stage 1 through stage 4 chronic kidney disease, or unspecified chronic kidney disease: Secondary | ICD-10-CM | POA: Diagnosis not present

## 2019-09-30 DIAGNOSIS — Z0000 Encounter for general adult medical examination without abnormal findings: Secondary | ICD-10-CM | POA: Diagnosis not present

## 2019-09-30 DIAGNOSIS — H3530 Unspecified macular degeneration: Secondary | ICD-10-CM | POA: Diagnosis not present

## 2019-09-30 DIAGNOSIS — E039 Hypothyroidism, unspecified: Secondary | ICD-10-CM | POA: Diagnosis not present

## 2019-09-30 DIAGNOSIS — M545 Low back pain: Secondary | ICD-10-CM | POA: Diagnosis not present

## 2019-09-30 DIAGNOSIS — E785 Hyperlipidemia, unspecified: Secondary | ICD-10-CM | POA: Diagnosis not present

## 2019-09-30 DIAGNOSIS — Z1331 Encounter for screening for depression: Secondary | ICD-10-CM | POA: Diagnosis not present

## 2019-09-30 DIAGNOSIS — E669 Obesity, unspecified: Secondary | ICD-10-CM | POA: Diagnosis not present

## 2019-09-30 DIAGNOSIS — Z1339 Encounter for screening examination for other mental health and behavioral disorders: Secondary | ICD-10-CM | POA: Diagnosis not present

## 2019-12-13 DIAGNOSIS — H35033 Hypertensive retinopathy, bilateral: Secondary | ICD-10-CM | POA: Diagnosis not present

## 2019-12-13 DIAGNOSIS — Z961 Presence of intraocular lens: Secondary | ICD-10-CM | POA: Diagnosis not present

## 2019-12-13 DIAGNOSIS — H3023 Posterior cyclitis, bilateral: Secondary | ICD-10-CM | POA: Diagnosis not present

## 2020-02-04 DIAGNOSIS — Z23 Encounter for immunization: Secondary | ICD-10-CM | POA: Diagnosis not present

## 2020-09-18 DIAGNOSIS — Z23 Encounter for immunization: Secondary | ICD-10-CM | POA: Diagnosis not present

## 2020-10-08 DIAGNOSIS — E785 Hyperlipidemia, unspecified: Secondary | ICD-10-CM | POA: Diagnosis not present

## 2020-10-08 DIAGNOSIS — Z0000 Encounter for general adult medical examination without abnormal findings: Secondary | ICD-10-CM | POA: Diagnosis not present

## 2020-10-08 DIAGNOSIS — E039 Hypothyroidism, unspecified: Secondary | ICD-10-CM | POA: Diagnosis not present

## 2020-10-15 DIAGNOSIS — I129 Hypertensive chronic kidney disease with stage 1 through stage 4 chronic kidney disease, or unspecified chronic kidney disease: Secondary | ICD-10-CM | POA: Diagnosis not present

## 2020-10-15 DIAGNOSIS — E785 Hyperlipidemia, unspecified: Secondary | ICD-10-CM | POA: Diagnosis not present

## 2020-10-15 DIAGNOSIS — Z1339 Encounter for screening examination for other mental health and behavioral disorders: Secondary | ICD-10-CM | POA: Diagnosis not present

## 2020-10-15 DIAGNOSIS — E669 Obesity, unspecified: Secondary | ICD-10-CM | POA: Diagnosis not present

## 2020-10-15 DIAGNOSIS — I10 Essential (primary) hypertension: Secondary | ICD-10-CM | POA: Diagnosis not present

## 2020-10-15 DIAGNOSIS — Z0000 Encounter for general adult medical examination without abnormal findings: Secondary | ICD-10-CM | POA: Diagnosis not present

## 2020-10-15 DIAGNOSIS — R82998 Other abnormal findings in urine: Secondary | ICD-10-CM | POA: Diagnosis not present

## 2020-10-15 DIAGNOSIS — Z1331 Encounter for screening for depression: Secondary | ICD-10-CM | POA: Diagnosis not present

## 2020-10-15 DIAGNOSIS — E039 Hypothyroidism, unspecified: Secondary | ICD-10-CM | POA: Diagnosis not present

## 2020-10-15 DIAGNOSIS — N1832 Chronic kidney disease, stage 3b: Secondary | ICD-10-CM | POA: Diagnosis not present

## 2020-10-15 DIAGNOSIS — L989 Disorder of the skin and subcutaneous tissue, unspecified: Secondary | ICD-10-CM | POA: Diagnosis not present

## 2020-10-15 DIAGNOSIS — M179 Osteoarthritis of knee, unspecified: Secondary | ICD-10-CM | POA: Diagnosis not present

## 2021-01-15 DIAGNOSIS — H35033 Hypertensive retinopathy, bilateral: Secondary | ICD-10-CM | POA: Diagnosis not present

## 2021-01-15 DIAGNOSIS — Z961 Presence of intraocular lens: Secondary | ICD-10-CM | POA: Diagnosis not present

## 2021-01-15 DIAGNOSIS — H3023 Posterior cyclitis, bilateral: Secondary | ICD-10-CM | POA: Diagnosis not present

## 2021-01-29 DIAGNOSIS — Z23 Encounter for immunization: Secondary | ICD-10-CM | POA: Diagnosis not present

## 2021-10-08 DIAGNOSIS — H35033 Hypertensive retinopathy, bilateral: Secondary | ICD-10-CM | POA: Diagnosis not present

## 2021-10-08 DIAGNOSIS — Z205 Contact with and (suspected) exposure to viral hepatitis: Secondary | ICD-10-CM | POA: Diagnosis not present

## 2021-10-08 DIAGNOSIS — Z1159 Encounter for screening for other viral diseases: Secondary | ICD-10-CM | POA: Diagnosis not present

## 2021-10-08 DIAGNOSIS — Z961 Presence of intraocular lens: Secondary | ICD-10-CM | POA: Diagnosis not present

## 2021-10-08 DIAGNOSIS — H3023 Posterior cyclitis, bilateral: Secondary | ICD-10-CM | POA: Diagnosis not present

## 2021-10-14 DIAGNOSIS — H3023 Posterior cyclitis, bilateral: Secondary | ICD-10-CM | POA: Diagnosis not present

## 2021-10-18 DIAGNOSIS — Z961 Presence of intraocular lens: Secondary | ICD-10-CM | POA: Diagnosis not present

## 2021-10-18 DIAGNOSIS — H35033 Hypertensive retinopathy, bilateral: Secondary | ICD-10-CM | POA: Diagnosis not present

## 2021-10-18 DIAGNOSIS — H3023 Posterior cyclitis, bilateral: Secondary | ICD-10-CM | POA: Diagnosis not present

## 2021-12-12 DIAGNOSIS — E039 Hypothyroidism, unspecified: Secondary | ICD-10-CM | POA: Diagnosis not present

## 2021-12-12 DIAGNOSIS — E785 Hyperlipidemia, unspecified: Secondary | ICD-10-CM | POA: Diagnosis not present

## 2021-12-12 DIAGNOSIS — I10 Essential (primary) hypertension: Secondary | ICD-10-CM | POA: Diagnosis not present

## 2021-12-12 DIAGNOSIS — R7989 Other specified abnormal findings of blood chemistry: Secondary | ICD-10-CM | POA: Diagnosis not present

## 2021-12-19 DIAGNOSIS — L989 Disorder of the skin and subcutaneous tissue, unspecified: Secondary | ICD-10-CM | POA: Diagnosis not present

## 2021-12-19 DIAGNOSIS — N1832 Chronic kidney disease, stage 3b: Secondary | ICD-10-CM | POA: Diagnosis not present

## 2021-12-19 DIAGNOSIS — M25551 Pain in right hip: Secondary | ICD-10-CM | POA: Diagnosis not present

## 2021-12-19 DIAGNOSIS — I10 Essential (primary) hypertension: Secondary | ICD-10-CM | POA: Diagnosis not present

## 2021-12-19 DIAGNOSIS — R82998 Other abnormal findings in urine: Secondary | ICD-10-CM | POA: Diagnosis not present

## 2021-12-19 DIAGNOSIS — M25519 Pain in unspecified shoulder: Secondary | ICD-10-CM | POA: Diagnosis not present

## 2021-12-19 DIAGNOSIS — E039 Hypothyroidism, unspecified: Secondary | ICD-10-CM | POA: Diagnosis not present

## 2021-12-19 DIAGNOSIS — I129 Hypertensive chronic kidney disease with stage 1 through stage 4 chronic kidney disease, or unspecified chronic kidney disease: Secondary | ICD-10-CM | POA: Diagnosis not present

## 2021-12-19 DIAGNOSIS — Z0000 Encounter for general adult medical examination without abnormal findings: Secondary | ICD-10-CM | POA: Diagnosis not present

## 2021-12-19 DIAGNOSIS — E785 Hyperlipidemia, unspecified: Secondary | ICD-10-CM | POA: Diagnosis not present

## 2022-01-09 ENCOUNTER — Encounter: Payer: Self-pay | Admitting: Sports Medicine

## 2022-01-09 ENCOUNTER — Ambulatory Visit (INDEPENDENT_AMBULATORY_CARE_PROVIDER_SITE_OTHER): Payer: Medicare Other | Admitting: Sports Medicine

## 2022-01-09 ENCOUNTER — Ambulatory Visit (INDEPENDENT_AMBULATORY_CARE_PROVIDER_SITE_OTHER): Payer: Medicare Other

## 2022-01-09 ENCOUNTER — Ambulatory Visit: Payer: Self-pay

## 2022-01-09 VITALS — BP 166/72 | HR 79

## 2022-01-09 DIAGNOSIS — M25551 Pain in right hip: Secondary | ICD-10-CM

## 2022-01-09 DIAGNOSIS — M1611 Unilateral primary osteoarthritis, right hip: Secondary | ICD-10-CM

## 2022-01-09 NOTE — Progress Notes (Signed)
[name] - 88 y.o. female MRN [id number]  Date of birth: 1931-10-28  Office Visit Note: Visit Date: 01/09/2022 PCP: Donnajean Lopes, MD Referred by: Donnajean Lopes, MD  Subjective: CC: right hip pain  HPI: [name] is a pleasant 88 y.o. female who presents today for right hip pain. Seen at the request and referral of Leanna Battles, MD.  Jackelyne has had a longstanding history of multiple joint pains, but the right hip has been much more bothersome here as of late.  She does not take much medication on a consistent basis as overall joint pain is normal for her.  She ports marked limitation of rotation and movement about the hip.  Does walk with an antalgic gait.  Has never had an injection into the hip.  Does not feel she is ready for hip replacement.  She does have a history of bilateral knee replacement and ankle fusion in years past.   Pertinent ROS were reviewed with the patient and found to be negative unless otherwise specified above in HPI.   Assessment & Plan: Visit Diagnoses:  1. Pain in right hip   2. Unilateral primary osteoarthritis, right hip    Plan: Reviewed the patient's x-ray with her and did discuss that she has severe end-stage bone-on-bone arthritis of the right hip.  She has not been improving much with conservative management.  We discussed the role for injection therapy today, she would like to proceed with this.  Ultrasound-guided injection into the right hip was performed, she tolerated this well.  Did give her typical postinjection treatment plan as well as ice and Tylenol for any postinjection pain.  Would like her to keep the hip moving.  She will follow-up in 4 to 6 weeks to discuss next steps.  At this point she is not thinking she would like to move forward with hip replacement, but has not completely ruled out off the table as of yet.  We will see how she does with the injection and follow-up as needed.  Follow-up: PRN   Meds & Orders: No  orders of the defined types were placed in this encounter.   Orders Placed This Encounter  Procedures   XR HIP UNILAT W OR W/O PELVIS 2-3 VIEWS RIGHT   US Guided Needle Placement - No Linked Charges     Procedures: Procedure: US-guided intra-articular hip injection, Right After discussion on risks/benefits/indications and informed verbal consent was obtained, a timeout was performed. Patient was lying supine on exam table. The hip was cleaned with betadine and alcohol swabs. Then utilizing ultrasound guidance, the patient's femoral head and neck junction was identified and subsequently injected with 4:1 lidocaine:depomedrol via an in-plane approach with ultrasound visualization of the injectate administered into the hip joint. Patient tolerated procedure well without immediate complications.       Clinical History: No specialty comments available.  She has no history on file for tobacco use. No results for input(s): "HGBA1C", "LABURIC" in the last 8760 hours.  Objective:   Vital Signs: BP (!) 166/72   Pulse 79   Physical Exam  Gen: Well-appearing, in no acute distress; non-toxic CV: Regular Rate. Well-perfused. Warm.  Resp: Breathing unlabored on room air; no wheezing. Psych: Fluid speech in conversation; appropriate affect; normal thought process Neuro: Sensation intact throughout. No gross coordination deficits.   Ortho Exam - Right hip: There is no specific bony TTP over the ASIS or greater trochanter.  Inspection demonstrates no overlying erythema or swelling.  There is marked limitation in range of motion both with passive internal and external rotation.  There is a complete bony block with FADIR testing as well as some associated pain.  Walks with decreased swing phase of the right hip.  Neurovascular intact distally.  Imaging: XR HIP UNILAT W OR W/O PELVIS 2-3 VIEWS RIGHT  Result Date: 01/09/2022 2 views of the right hip including AP pelvis and lateral view were ordered and  reviewed by myself.  X-rays demonstrate severe end-stage bone-on-bone osteoarthritis of the right hip.  There is significant bony spurring formation as well as a subchondral cyst within the superior lateral aspect of the femoral head.  Single view of the contralateral hip shows at least moderate to severe OA as well.  No acute fracture noted.   Past Medical/Family/Surgical/Social History: Medications & Allergies reviewed per EMR, new medications updated. There are no problems to display for this patient.  History reviewed. No pertinent past medical history. History reviewed. No pertinent family history. History reviewed. No pertinent surgical history. Social History   Occupational History   Not on file  Tobacco Use   Smoking status: Not on file   Smokeless tobacco: Not on file  Substance and Sexual Activity   Alcohol use: Not on file   Drug use: Not on file   Sexual activity: Not on file

## 2022-01-21 DIAGNOSIS — Z961 Presence of intraocular lens: Secondary | ICD-10-CM | POA: Diagnosis not present

## 2022-01-21 DIAGNOSIS — H3023 Posterior cyclitis, bilateral: Secondary | ICD-10-CM | POA: Diagnosis not present

## 2022-01-21 DIAGNOSIS — H35033 Hypertensive retinopathy, bilateral: Secondary | ICD-10-CM | POA: Diagnosis not present

## 2022-02-07 DIAGNOSIS — Z23 Encounter for immunization: Secondary | ICD-10-CM | POA: Diagnosis not present

## 2022-02-19 ENCOUNTER — Emergency Department (HOSPITAL_COMMUNITY): Payer: Medicare Other

## 2022-02-19 ENCOUNTER — Inpatient Hospital Stay (HOSPITAL_COMMUNITY)
Admission: EM | Admit: 2022-02-19 | Discharge: 2022-02-24 | DRG: 280 | Disposition: A | Discharge status: Home / Self Care | Payer: Medicare Other | Attending: Interventional Cardiology | Admitting: Interventional Cardiology

## 2022-02-19 ENCOUNTER — Encounter (HOSPITAL_COMMUNITY): Payer: Self-pay | Admitting: Emergency Medicine

## 2022-02-19 DIAGNOSIS — I25118 Atherosclerotic heart disease of native coronary artery with other forms of angina pectoris: Secondary | ICD-10-CM | POA: Diagnosis not present

## 2022-02-19 DIAGNOSIS — R778 Other specified abnormalities of plasma proteins: Secondary | ICD-10-CM | POA: Diagnosis not present

## 2022-02-19 DIAGNOSIS — J9811 Atelectasis: Secondary | ICD-10-CM | POA: Diagnosis not present

## 2022-02-19 DIAGNOSIS — R7989 Other specified abnormal findings of blood chemistry: Secondary | ICD-10-CM | POA: Diagnosis not present

## 2022-02-19 DIAGNOSIS — I2582 Chronic total occlusion of coronary artery: Secondary | ICD-10-CM | POA: Diagnosis present

## 2022-02-19 DIAGNOSIS — I214 Non-ST elevation (NSTEMI) myocardial infarction: Secondary | ICD-10-CM | POA: Diagnosis present

## 2022-02-19 DIAGNOSIS — Z66 Do not resuscitate: Secondary | ICD-10-CM | POA: Diagnosis present

## 2022-02-19 DIAGNOSIS — R0602 Shortness of breath: Secondary | ICD-10-CM | POA: Diagnosis not present

## 2022-02-19 DIAGNOSIS — I4891 Unspecified atrial fibrillation: Secondary | ICD-10-CM | POA: Diagnosis not present

## 2022-02-19 DIAGNOSIS — I4819 Other persistent atrial fibrillation: Secondary | ICD-10-CM | POA: Diagnosis not present

## 2022-02-19 DIAGNOSIS — R0789 Other chest pain: Secondary | ICD-10-CM | POA: Diagnosis not present

## 2022-02-19 DIAGNOSIS — Z6830 Body mass index (BMI) 30.0-30.9, adult: Secondary | ICD-10-CM

## 2022-02-19 DIAGNOSIS — I2510 Atherosclerotic heart disease of native coronary artery without angina pectoris: Secondary | ICD-10-CM | POA: Diagnosis present

## 2022-02-19 DIAGNOSIS — R591 Generalized enlarged lymph nodes: Secondary | ICD-10-CM | POA: Diagnosis not present

## 2022-02-19 DIAGNOSIS — Z79899 Other long term (current) drug therapy: Secondary | ICD-10-CM

## 2022-02-19 DIAGNOSIS — E871 Hypo-osmolality and hyponatremia: Secondary | ICD-10-CM | POA: Diagnosis not present

## 2022-02-19 DIAGNOSIS — R0609 Other forms of dyspnea: Secondary | ICD-10-CM | POA: Diagnosis not present

## 2022-02-19 DIAGNOSIS — I255 Ischemic cardiomyopathy: Secondary | ICD-10-CM

## 2022-02-19 DIAGNOSIS — N179 Acute kidney failure, unspecified: Secondary | ICD-10-CM | POA: Diagnosis present

## 2022-02-19 DIAGNOSIS — E785 Hyperlipidemia, unspecified: Secondary | ICD-10-CM | POA: Diagnosis present

## 2022-02-19 DIAGNOSIS — I2102 ST elevation (STEMI) myocardial infarction involving left anterior descending coronary artery: Secondary | ICD-10-CM | POA: Diagnosis not present

## 2022-02-19 DIAGNOSIS — I5041 Acute combined systolic (congestive) and diastolic (congestive) heart failure: Secondary | ICD-10-CM

## 2022-02-19 DIAGNOSIS — I110 Hypertensive heart disease with heart failure: Secondary | ICD-10-CM | POA: Diagnosis present

## 2022-02-19 DIAGNOSIS — I4892 Unspecified atrial flutter: Secondary | ICD-10-CM | POA: Diagnosis not present

## 2022-02-19 DIAGNOSIS — I5043 Acute on chronic combined systolic (congestive) and diastolic (congestive) heart failure: Secondary | ICD-10-CM | POA: Diagnosis not present

## 2022-02-19 DIAGNOSIS — R911 Solitary pulmonary nodule: Secondary | ICD-10-CM | POA: Diagnosis not present

## 2022-02-19 DIAGNOSIS — I5023 Acute on chronic systolic (congestive) heart failure: Secondary | ICD-10-CM | POA: Diagnosis not present

## 2022-02-19 DIAGNOSIS — J811 Chronic pulmonary edema: Secondary | ICD-10-CM | POA: Diagnosis not present

## 2022-02-19 DIAGNOSIS — I484 Atypical atrial flutter: Secondary | ICD-10-CM | POA: Diagnosis not present

## 2022-02-19 LAB — CBC
HCT: 37.1 % (ref 36.0–46.0)
Hemoglobin: 12.2 g/dL (ref 12.0–15.0)
MCH: 29.4 pg (ref 26.0–34.0)
MCHC: 32.9 g/dL (ref 30.0–36.0)
MCV: 89.4 fL (ref 80.0–100.0)
Platelets: 347 10*3/uL (ref 150–400)
RBC: 4.15 MIL/uL (ref 3.87–5.11)
RDW: 12.5 % (ref 11.5–15.5)
WBC: 9.1 10*3/uL (ref 4.0–10.5)
nRBC: 0 % (ref 0.0–0.2)

## 2022-02-19 LAB — ECHOCARDIOGRAM COMPLETE
Height: 62.75 in
Weight: 2864 oz

## 2022-02-19 LAB — TROPONIN I (HIGH SENSITIVITY)
Troponin I (High Sensitivity): 2400 ng/L (ref <18)
Troponin I (High Sensitivity): 2268 ng/L (ref <18)

## 2022-02-19 LAB — TSH: TSH: 4.135 u[IU]/mL (ref 0.350–4.500)

## 2022-02-19 LAB — BASIC METABOLIC PANEL
Anion gap: 11 (ref 5–15)
BUN: 22 mg/dL (ref 8–23)
CO2: 22 mmol/L (ref 22–32)
Calcium: 9.1 mg/dL (ref 8.9–10.3)
Chloride: 103 mmol/L (ref 98–111)
Creatinine, Ser: 1.46 mg/dL — ABNORMAL HIGH (ref 0.44–1.00)
GFR, Estimated: 34 mL/min — ABNORMAL LOW (ref >60)
Glucose, Bld: 115 mg/dL — ABNORMAL HIGH (ref 70–99)
Potassium: 4.4 mmol/L (ref 3.5–5.1)
Sodium: 136 mmol/L (ref 135–145)

## 2022-02-19 LAB — MRSA NEXT GEN BY PCR, NASAL: MRSA by PCR Next Gen: NOT DETECTED

## 2022-02-19 LAB — BRAIN NATRIURETIC PEPTIDE: B Natriuretic Peptide: 1168.8 pg/mL — ABNORMAL HIGH (ref 0.0–100.0)

## 2022-02-19 MED ORDER — ASPIRIN 81 MG PO TBEC
81.0000 mg | DELAYED_RELEASE_TABLET | Freq: Every day | ORAL | Status: DC
  Administered 2022-02-20 – 2022-02-24 (×5): 81 mg via ORAL
  Filled 2022-02-19 (×5): qty 1

## 2022-02-19 MED ORDER — DILTIAZEM HCL-DEXTROSE 125-5 MG/125ML-% IV SOLN (PREMIX)
5.0000 mg/h | INTRAVENOUS | Status: DC
  Administered 2022-02-19: 5 mg/h via INTRAVENOUS
  Administered 2022-02-19: 10 mg/h via INTRAVENOUS
  Administered 2022-02-20: 5 mg/h via INTRAVENOUS
  Filled 2022-02-19 (×2): qty 125

## 2022-02-19 MED ORDER — ONDANSETRON HCL 4 MG/2ML IJ SOLN: 4.0000 mg | Freq: Four times a day (QID) | INTRAMUSCULAR | Status: DC | PRN

## 2022-02-19 MED ORDER — HEPARIN (PORCINE) 25000 UT/250ML-% IV SOLN
1000.0000 [IU]/h | INTRAVENOUS | Status: DC
  Administered 2022-02-19: 1000 [IU]/h via INTRAVENOUS
  Filled 2022-02-19: qty 250

## 2022-02-19 MED ORDER — ASPIRIN 325 MG PO TABS
325.0000 mg | ORAL_TABLET | Freq: Every day | ORAL | Status: DC
  Administered 2022-02-19: 325 mg via ORAL
  Filled 2022-02-19: qty 1

## 2022-02-19 MED ORDER — AMIODARONE HCL IN DEXTROSE 360-4.14 MG/200ML-% IV SOLN: 30.0000 mg/h | INTRAVENOUS | Status: DC

## 2022-02-19 MED ORDER — ATORVASTATIN CALCIUM 80 MG PO TABS
80.0000 mg | ORAL_TABLET | Freq: Every day | ORAL | Status: DC
  Administered 2022-02-20 – 2022-02-24 (×5): 80 mg via ORAL
  Filled 2022-02-19 (×5): qty 1

## 2022-02-19 MED ORDER — PERFLUTREN LIPID MICROSPHERE
1.0000 mL | INTRAVENOUS | Status: AC | PRN
  Administered 2022-02-19: 8 mL via INTRAVENOUS

## 2022-02-19 MED ORDER — SODIUM CHLORIDE 0.9 % WEIGHT BASED INFUSION: 1.0000 mL/kg/h | INTRAVENOUS | Status: DC

## 2022-02-19 MED ORDER — NITROGLYCERIN 0.4 MG SL SUBL: 0.4000 mg | SUBLINGUAL_TABLET | SUBLINGUAL | Status: DC | PRN

## 2022-02-19 MED ORDER — SODIUM CHLORIDE 0.9 % WEIGHT BASED INFUSION
3.0000 mL/kg/h | INTRAVENOUS | Status: AC
  Administered 2022-02-20: 3 mL/kg/h via INTRAVENOUS

## 2022-02-19 MED ORDER — AMIODARONE HCL IN DEXTROSE 360-4.14 MG/200ML-% IV SOLN: 60.0000 mg/h | INTRAVENOUS | Status: DC

## 2022-02-19 MED ORDER — HEPARIN BOLUS VIA INFUSION
4000.0000 [IU] | Freq: Once | INTRAVENOUS | Status: AC
  Administered 2022-02-19: 4000 [IU] via INTRAVENOUS
  Filled 2022-02-19: qty 4000

## 2022-02-19 MED ORDER — ATORVASTATIN CALCIUM 40 MG PO TABS: 40.0000 mg | ORAL_TABLET | Freq: Every day | ORAL | Status: DC

## 2022-02-19 MED ORDER — METOPROLOL TARTRATE 12.5 MG HALF TABLET
12.5000 mg | ORAL_TABLET | Freq: Two times a day (BID) | ORAL | Status: DC
  Administered 2022-02-19: 12.5 mg via ORAL
  Filled 2022-02-19: qty 1

## 2022-02-19 MED ORDER — AMIODARONE LOAD VIA INFUSION: 150.0000 mg | Freq: Once | INTRAVENOUS | Status: DC

## 2022-02-19 MED ORDER — DILTIAZEM LOAD VIA INFUSION
10.0000 mg | Freq: Once | INTRAVENOUS | Status: AC
  Administered 2022-02-19: 10 mg via INTRAVENOUS
  Filled 2022-02-19: qty 10

## 2022-02-19 MED ORDER — ACETAMINOPHEN 325 MG PO TABS: 650.0000 mg | ORAL_TABLET | ORAL | Status: DC | PRN

## 2022-02-19 NOTE — ED Provider Triage Note (Signed)
Emergency Medicine Provider Triage Evaluation Note  [name] , a 88 y.o. female  was evaluated in triage.  Pt complains of shortness of breath, generalized weakness and dizziness.  Patient states the symptoms began approximately 2 to 3 days ago.  She feels symptoms with minimal physical activity.  Notes over the past month increasing dark stools.  Denies fever, cough, congestion, chest pain, abdominal pain, nausea, vomiting, hematochezia.  Review of Systems  Positive: See above Negative:   Physical Exam  BP 112/60 (BP Location: Right Arm)   Pulse 94   Temp 98.5 F (36.9 C) (Oral)   Resp 18   SpO2 100%  Gen:   Awake, no distress   Resp:  Normal effort  MSK:   Moves extremities without difficulty  Other:  Noted on tenderness.  Lungs clear to auscultation bilaterally.  Medical Decision Making  Medically screening exam initiated at 12:18 PM.  Appropriate orders placed.  [name] was informed that the remainder of the evaluation will be completed by another provider, this initial triage assessment does not replace that evaluation, and the importance of remaining in the ED until their evaluation is complete.     [name], [place] 02/19/22 1220

## 2022-02-19 NOTE — Progress Notes (Signed)
ANTICOAGULATION CONSULT NOTE - Initial Consult  Pharmacy Consult for heparin Indication: atrial fibrillation  No Known Allergies  Patient Measurements: Height: 5' 2.75" (159.4 cm) Weight: 81.2 kg (179 lb) IBW/kg (Calculated) : 51.83 Heparin Dosing Weight: 69.7kg  Vital Signs: Temp: 98.5 F (36.9 C) (11/01 1156) Temp Source: Oral (11/01 1156) BP: 112/82 (11/01 1415) Pulse Rate: 149 (11/01 1418)  Labs: Recent Labs    02/19/22 1212  HGB 12.2  HCT 37.1  PLT 347  CREATININE 1.46*  TROPONINIHS 2,400*    Estimated Creatinine Clearance: 26.7 mL/min (A) (by C-G formula based on SCr of 1.46 mg/dL (H)).   Medical History: History reviewed. No pertinent past medical history.  Medications:  Infusions:   diltiazem (CARDIZEM) infusion     heparin      Assessment: 28 yof presented to the ED with dizziness and SOB. Found to be in afib and now starting IV heparin. Baseline CBC is WNL. She is not on anticoagulation PTA.   Goal of Therapy:  Heparin level 0.3-0.7 units/ml Monitor platelets by anticoagulation protocol: Yes   Plan:  Heparin bolus 4000 units IV x 1 Heparin gtt 1000 units/hr Check an 8 hr heparin level Daily heparin level and CBC  Justene Jensen, Rande Lawman 02/19/2022,2:28 PM

## 2022-02-19 NOTE — Progress Notes (Signed)
ANTICOAGULATION CONSULT NOTE - Initial Consult  Pharmacy Consult for heparin Indication: atrial fibrillation  Allergies  Allergen Reactions   Bromfenac Other (See Comments)    Hives, welts   Lisinopril     Other Reaction(s): arthralgias   Meloxicam Other (See Comments)    unknown   Tramadol Hcl     Other Reaction(s): ineffective   Valacyclovir     GI Upset    Patient Measurements: Height: 5' 2.75" (159.4 cm) Weight: 81.2 kg (179 lb) IBW/kg (Calculated) : 51.83 Heparin Dosing Weight: 69.7kg  Vital Signs: Temp: 98.5 F (36.9 C) (11/01 1156) Temp Source: Oral (11/01 1156) BP: 107/65 (11/01 1527) Pulse Rate: 77 (11/01 1527)  Labs: Recent Labs    02/19/22 1212 02/19/22 1430  HGB 12.2  --   HCT 37.1  --   PLT 347  --   CREATININE 1.46*  --   TROPONINIHS 2,400* 2,268*     Estimated Creatinine Clearance: 26.7 mL/min (A) (by C-G formula based on SCr of 1.46 mg/dL (H)).   Medical History: History reviewed. No pertinent past medical history.  Medications:  Infusions:   diltiazem (CARDIZEM) infusion 5 mg/hr (02/19/22 1432)   heparin 1,000 Units/hr (02/19/22 1440)    Assessment: 31 yof presented to the ED with dizziness and SOB. Found to be in afib and now starting IV heparin. Baseline CBC is WNL. She is not on anticoagulation PTA.   Heparin gtt initially started for AF per pharmacy consult. Cardiology has asked to change to ACS heparin dosing for now.   4000 unit IV heparin bolus has already been given and heparin infusion started at 1000 unit/hr.  Goal of Therapy:  Heparin level 0.3-0.7 units/ml Monitor platelets by anticoagulation protocol: Yes   Plan:  Decrease heparin infusion rate to 850 units/hr Check heparin level at midnight Daily heparin level and CBC  Lorelei Pont, PharmD, BCPS 02/19/2022 4:32 PM ED Clinical Pharmacist -  [phone]

## 2022-02-19 NOTE — ED Provider Notes (Signed)
[facility] EMERGENCY DEPARTMENT Provider Note   CSN: [id number] Arrival date & time: 02/19/22  1134     History  Chief Complaint  Patient presents with   Shortness of [place] is a 88 y.o. female.  HPI 88 year old female history of hypertension presents today complaining of dyspnea, decreased energy, and lightheadedness with for the past 3 to 4 days.  She denies any chest pain.  She denies any fever, chills, cough, lateralized leg swelling, peripheral edema, history of DVT or PE.  She has not previously been evaluated by a cardiologist.  She states that she is taking her blood pressure medications as prescribed.     Home Medications Prior to Admission medications   Not on File      Allergies    Patient has no known allergies.    Review of Systems   Review of Systems  Physical Exam Updated Vital Signs BP 123/63   Pulse 79   Temp 98.5 F (36.9 C) (Oral)   Resp 13   Ht 1.594 m (5' 2.75")   Wt 81.2 kg   SpO2 96%   BMI 31.96 kg/m  Physical Exam Vitals and nursing note reviewed.  Constitutional:      General: She is not in acute distress.    Appearance: She is well-developed.  HENT:     Head: Normocephalic.     Mouth/Throat:     Mouth: Mucous membranes are moist.     Pharynx: Oropharynx is clear.  Eyes:     Pupils: Pupils are equal, round, and reactive to light.  Cardiovascular:     Rate and Rhythm: Tachycardia present. Rhythm irregular.  Pulmonary:     Effort: Pulmonary effort is normal.     Breath sounds: No decreased breath sounds.  Abdominal:     General: Bowel sounds are normal.     Palpations: Abdomen is soft.  Musculoskeletal:        General: Normal range of motion.     Cervical back: Normal range of motion and neck supple.     Right lower leg: No tenderness. No edema.     Left lower leg: No tenderness. No edema.  Skin:    General: Skin is warm and dry.     Capillary Refill: Capillary refill takes less than 2  seconds.  Neurological:     General: No focal deficit present.     Mental Status: She is alert.  Psychiatric:        Mood and Affect: Mood normal.     ED Results / Procedures / Treatments   Labs (all labs ordered are listed, but only abnormal results are displayed) Labs Reviewed  BASIC METABOLIC PANEL - Abnormal; Notable for the following components:      Result Value   Glucose, Bld 115 (*)    Creatinine, Ser 1.46 (*)    GFR, Estimated 34 (*)    All other components within normal limits  TROPONIN I (HIGH SENSITIVITY) - Abnormal; Notable for the following components:   Troponin I (High Sensitivity) 2,400 (*)    All other components within normal limits  CBC  HEPARIN LEVEL (UNFRACTIONATED)  POC OCCULT BLOOD, ED  TROPONIN I (HIGH SENSITIVITY)    EKG EKG Interpretation  Date/Time:  Wednesday February 19 2022 14:05:44 EDT Ventricular Rate:  112 PR Interval:    QRS Duration: 86 QT Interval:  314 QTC Calculation: 429 R Axis:   -40 Text Interpretation: Atrial fibrillation Left anterior  fascicular block Anterior infarct, old Borderline repolarization abnormality although afib with rvr is new, qrs morphology appears unchanged Confirmed by Pattricia Boss [phone]) on 02/19/2022 2:24:23 PM  Radiology DG Chest 2 View  Result Date: 02/19/2022 CLINICAL DATA:  Provided history: Chest pain. Additional history provided: Shortness of breath and chest pressure. History of hypertension. EXAM: CHEST - 2 VIEW COMPARISON:  Radiographs 03/12/2010 and earlier. FINDINGS: Heart size within normal limits. Aortic atherosclerosis. Prominence of the hila bilaterally, increased from the prior examination of 03/12/2010. 4 mm nodular opacity projecting in the region of the right upper lobe on the PA radiograph. Small ill-defined opacities within the bilateral lung bases, likely reflecting atelectasis. No evidence of pleural effusion or pneumothorax. Degenerative changes of the spine. IMPRESSION: Prominence of  the hila bilaterally, increased from the prior examination of 03/12/2010. Although nonspecific, this finding can reflect hilar lymphadenopathy and a contrast-enhanced chest CT is recommended for further evaluation. 4 mm nodular opacity projecting in the region of the right upper lobe on the PA radiograph. This may reflect a small pulmonary nodule. Attention recommended at time of CT follow-up. Prominence of the interstitial lung markings. Findings may reflect chronic interstitial changes and/or interstitial edema. Mild bibasilar atelectasis. Electronically Signed   By: Kellie Simmering D.O.   On: 02/19/2022 12:49    Procedures .Critical Care  Performed by: Pattricia Boss, MD Authorized by: Pattricia Boss, MD   Critical care provider statement:    Critical care time (minutes):  45   Critical care end time:  02/19/2022 3:21 PM   Critical care was necessary to treat or prevent imminent or life-threatening deterioration of the following conditions:  Cardiac failure     Medications Ordered in ED Medications  diltiazem (CARDIZEM) 1 mg/mL load via infusion 10 mg (10 mg Intravenous Bolus from Bag 02/19/22 1432)    And  diltiazem (CARDIZEM) 125 mg in dextrose 5% 125 mL (1 mg/mL) infusion (5 mg/hr Intravenous New Bag/Given 02/19/22 1432)  heparin ADULT infusion 100 units/mL (25000 units/263m) (1,000 Units/hr Intravenous New Bag/Given 02/19/22 1440)  heparin bolus via infusion 4,000 Units (4,000 Units Intravenous Bolus from Bag 02/19/22 1441)    ED Course/ Medical Decision Making/ A&P Clinical Course as of 02/19/22 1523  Wed Feb 19, 2022  1504 Troponin significantly elevated at 2400 [DR]  19449Basic metabolic panel reviewed interpreted and significant for creatinine elevated at 1.46 [DR]  1515   X-Daryn Pisani reviewed and interpreted and no evidence of cardiomegaly or consolidation noted on my review radiologist interpretation notes prominence of the hila bilaterally and note that there recommendations  contrast-enhanced chest CT for further evaluation A 4 mm nodular opacity is also noted.  Again recommended CT follow-up and prominence of interstitial lung markings which may reflect chronic interstitial changes and or interstitial edema [DR]    Clinical Course User Index [DR] RPattricia Boss MD                           Medical Decision Making 88year old female presents today with atrial fibrillation with rapid ventricular response, elevated troponin, and abnormal chest x-Danarius Mcconathy. EKG A-fib RVR with elevated ST segments in V1 through V3, however compared to prior EKGs, although not prior with A-fib, these ST changes were present. However, due to significantly elevated troponin, cardiology was consulted.  Discussed care with Dr. STamala Julianwho will see for admission 1 A-fib RVR rate controlled here with Cardizem drip.  Heparin initiated no evidence of metabolic abnormalities or electrolyte  abnormalities contributing 2 elevated troponin question secondary to CAD and ischemia versus demand ischemia.  No chest pain noted.  Heparin initiated 3 elevated creatinine from baseline 4 chest abnormalities noted by radiology unlikely contributory to today's but will need follow-up with CT scans and patient is hemodynamically stable  Amount and/or Complexity of Data Reviewed Labs: ordered. Decision-making details documented in ED Course. Radiology: ordered and independent interpretation performed. Decision-making details documented in ED Course. ECG/medicine tests: ordered and independent interpretation performed. Decision-making details documented in ED Course. Discussion of management or test interpretation with external provider(s): Care discussed with cardiologist  Risk Drug therapy requiring intensive monitoring for toxicity. Decision regarding hospitalization. Diagnosis or treatment significantly limited by social determinants of health.           Final Clinical Impression(s) / ED  Diagnoses Final diagnoses:  Atrial fibrillation with RVR ([place])  Elevated troponin    Rx / DC Orders ED Discharge Orders     None         Pattricia Boss, MD 02/19/22 1523

## 2022-02-19 NOTE — H&P (Addendum)
The patient has been seen in conjunction with Lily Kocher, PAC. All aspects of care have been considered and discussed. The patient has been personally interviewed, examined, and all clinical data has been reviewed.  Very pleasant 88 year old who came in because of a several day history (less than 1 week) exertional fatigue and dyspnea.  During this time she had occasional lightheadedness and diaphoresis. No gallop is heard on auscultation Troponin I is elevated greater than 2000x2.  BNP is pending During observation of echocardiography being performed, anteroapical segment has severe hypokinesis.  This is consistent with the appearance of the patient's EKG which shows poor R wave progression V1 through V5 with QS pattern V1 through V3. EKG demonstrates atrial fibrillation with rapid ventricular response.  No prior history of atrial fib.  Overall, the patient presents with atrial fibrillation with rapid ventricular response and symptoms consistent with acute on chronic congestive heart failure (likely chronic combined systolic and diastolic) related to rhythm and underlying LV dysfunction due to coronary disease.  Plans: Start IV heparin; slow heart rate with diltiazem; consider coronary angiography; consider electrical cardioversion or pharmacologically induced cardioversion with IV amiodarone depending upon appearance of echo.   Cardiology Admission History and Physical   Patient ID: LUS KRIEGEL MRN: [id number]; DOB: 26-Jul-1931   Admission date: 02/19/2022  PCP:  Donnajean Lopes, MD   [place] Providers Cardiologist:  None        Chief Complaint: Shortness of breath, lethargy, lightheadedness  Patient Profile:   [name] is a 88 y.o. female with hx hypertension, hyperlipidemia, intermediate uveitis non granulomatous who is being seen 02/19/2022 for the evaluation of new onset atrial fibrillation/flutter.  History of Present Illness:   Ms. [name] is  a functional and independent living 88 year old female with above noted limited medical history.  Patient presented to the emergency department this morning at the urging of her sons.  Patient says that for the past 5 days or so she has felt unusually fatigued with shortness of breath.  Patient has continued to do her normal activities but has not felt normal doing them.  Denies symptoms of chest pain, orthopnea, palpitations.  Also denies nausea, vomiting, diarrhea.  Patient says that 2 times over the weekend she had onset of what she refers to as a hot flash, with sweating.  These episodes were self-limited/short lasting.  She denies any known sick contacts.  Patient says that she started Cellcept per ophthalmology in early October and since taking she has noticed that her total daily urine output has been reduced.  She wonders if this has anything to do with recent fatigue.  Per patient, total daily fluid intake is around 60 ounces.  Patient says that there she has never taken any medicine for her cholesterol, she recalls recent total cholesterol numbers being around 200. A review of external records shows lipid panel as below as of 12/19/2021.   CHOLESTEROL 225 100 - 200 mg/dl   TRIGLYCERIDES 109 30 - 149 mg/dl   HDL 49 40 - 60 mg/dl   LDL 154 < mg/dl     ED work-up notable for elevated troponin, 2402 -> 2268.  BMP notable for AKI, creatinine 1.46.  Patient denies any cardiac history, does not see an outpatient cardiologist.  History reviewed. No pertinent past medical history.  No past surgical history on file.   Medications Prior to Admission: Prior to Admission medications   Not on File     Allergies:   No  Known Allergies  Social History:   Social History   Socioeconomic History   Marital status: Widowed    Spouse name: Not on file   Number of children: Not on file   Years of education: Not on file   Highest education level: Not on file  Occupational History   Not on file   Tobacco Use   Smoking status: Not on file   Smokeless tobacco: Not on file  Substance and Sexual Activity   Alcohol use: Not on file   Drug use: Not on file   Sexual activity: Not on file  Other Topics Concern   Not on file  Social History Narrative   Not on file   Social Determinants of Health   Financial Resource Strain: Not on file  Food Insecurity: Not on file  Transportation Needs: Not on file  Physical Activity: Not on file  Stress: Not on file  Social Connections: Not on file  Intimate Partner Violence: Not on file    Family History:   The patient's family history is not on file.    ROS:  Please see the history of present illness.  All other ROS reviewed and negative.     Physical Exam/Data:   Vitals:   02/19/22 1430 02/19/22 1436 02/19/22 1500 02/19/22 1527  BP: 123/63  107/65 107/65  Pulse: (!) 107 79  77  Resp: (!) '21 13  18  '$ Temp:      TempSrc:      SpO2: 91% 96% 93% 93%  Weight:      Height:       No intake or output data in the 24 hours ending 02/19/22 1620    02/19/2022    2:18 PM  Last 3 Weights  Weight (lbs) 179 lb  Weight (kg) 81.194 kg     Body mass index is 31.96 kg/m.  General:  Well nourished, well developed, in no acute distress HEENT: normal Neck: no JVD Vascular: No carotid bruits; Distal pulses 2+ bilaterally   Cardiac:  normal S1, S2; irregularly irregular, no murmur Lungs:  clear to auscultation bilaterally, no wheezing, rhonchi or rales  Abd: soft, nontender, no hepatomegaly  Ext: no edema Musculoskeletal:  No deformities, BUE and BLE strength normal and equal Skin: warm and dry  Neuro:  CNs 2-12 intact, no focal abnormalities noted Psych:  Normal affect    EKG:  The ECG that was done 11/1 was personally reviewed and demonstrates likely atrial flutter with variable conduction.  Patient with nonacute anterior lead changes, possibly indicative of an old infarct, though left axis shift, left anterior fascicular block makes  interpretation challenging.  Relevant CV Studies:  N/A  Laboratory Data:  High Sensitivity Troponin:   Recent Labs  Lab 02/19/22 1212 02/19/22 1430  TROPONINIHS 2,400* 2,268*      Chemistry Recent Labs  Lab 02/19/22 1212  NA 136  K 4.4  CL 103  CO2 22  GLUCOSE 115*  BUN 22  CREATININE 1.46*  CALCIUM 9.1  GFRNONAA 34*  ANIONGAP 11    No results for input(s): "PROT", "ALBUMIN", "AST", "ALT", "ALKPHOS", "BILITOT" in the last 168 hours. Lipids No results for input(s): "CHOL", "TRIG", "HDL", "LABVLDL", "LDLCALC", "CHOLHDL" in the last 168 hours. Hematology Recent Labs  Lab 02/19/22 1212  WBC 9.1  RBC 4.15  HGB 12.2  HCT 37.1  MCV 89.4  MCH 29.4  MCHC 32.9  RDW 12.5  PLT 347   Thyroid No results for input(s): "TSH", "FREET4" in the  last 168 hours. BNPNo results for input(s): "BNP", "PROBNP" in the last 168 hours.  DDimer No results for input(s): "DDIMER" in the last 168 hours.   Radiology/Studies:  DG Chest 2 View  Result Date: 02/19/2022 CLINICAL DATA:  Provided history: Chest pain. Additional history provided: Shortness of breath and chest pressure. History of hypertension. EXAM: CHEST - 2 VIEW COMPARISON:  Radiographs 03/12/2010 and earlier. FINDINGS: Heart size within normal limits. Aortic atherosclerosis. Prominence of the hila bilaterally, increased from the prior examination of 03/12/2010. 4 mm nodular opacity projecting in the region of the right upper lobe on the PA radiograph. Small ill-defined opacities within the bilateral lung bases, likely reflecting atelectasis. No evidence of pleural effusion or pneumothorax. Degenerative changes of the spine. IMPRESSION: Prominence of the hila bilaterally, increased from the prior examination of 03/12/2010. Although nonspecific, this finding can reflect hilar lymphadenopathy and a contrast-enhanced chest CT is recommended for further evaluation. 4 mm nodular opacity projecting in the region of the right upper lobe on  the PA radiograph. This may reflect a small pulmonary nodule. Attention recommended at time of CT follow-up. Prominence of the interstitial lung markings. Findings may reflect chronic interstitial changes and/or interstitial edema. Mild bibasilar atelectasis. Electronically Signed   By: Kellie Simmering D.O.   On: 02/19/2022 12:49     Assessment and Plan:   Fatigue Dysnpea  Patient with remarkably little medical history for her age presents in the setting of unusual fatigue and dyspnea.  Troponin notably elevated: 2400 -> 2268.  EKG concerning for prior anterior MI .  Given elevated LDL, suspect that patient may have undiagnosed CAD.  Transthoracic echocardiogram ordered.  Sonography reviewed in real-time with images concerning for anteroapical wall motion abnormality.  Final read pending.  Will put patient on the board for left heart catheterization and possible PCI tomorrow.  NPO at midnight. We will initiate beta-blocker therapy.  Start with low-dose metoprolol tartrate 12.5 twice daily. Continue heparin Aspirin 324 mg ordered Chest x-ray without any obvious vascular congestion or cardiomegaly, but will check BNP  Shared Decision Making/Informed Consent{ The risks [stroke (1 in 1000), death (1 in 1000), kidney failure [usually temporary] (1 in 500), bleeding (1 in 200), allergic reaction [possibly serious] (1 in 200)], benefits (diagnostic support and management of coronary artery disease) and alternatives of a cardiac catheterization were discussed in detail with Ms. Kaczmarczyk and she is willing to proceed.   New onset atrial flutter with variable conduction  ECG tracings in the emergency department show irregularly irregular rhythm.  Appears to be atrial flutter with variable conduction, occasionally 2:1 conduction pattern.  Continue diltiazem infusion for rate control. Beta-blocker initiated for suspected CAD. Continue heparin (ACS protocol). Check TSH CHA2DS2-VASc Score = 4.  Likely to  need ongoing anticoagulation.    Hyperlipidemia  Patient not on lipid lowering therapy. Lipid panel 12/19/21 with lipids as below.  CHOLESTEROL 225 100 - 200 mg/dl   TRIGLYCERIDES 109 30 - 149 mg/dl   HDL 49 40 - 60 mg/dl   LDL 154 < mg/dl    Start Atorvastatin '80mg'$ . Obtain baseline LFT tomorrow.     Risk Assessment/Risk Scores:    TIMI Risk Score for Unstable Angina or Non-ST Elevation MI:   The patient's TIMI risk score is 3, which indicates a 13% risk of all cause mortality, new or recurrent myocardial infarction or need for urgent revascularization in the next 14 days.    CHA2DS2-VASc Score = 4   This indicates a 4.8% annual risk  of stroke. The patient's score is based upon: CHF History: 0 HTN History: 1 Diabetes History: 0 Stroke History: 0 Vascular Disease History: 0 Age Score: 2 Gender Score: 1      Severity of Illness: The appropriate patient status for this patient is INPATIENT. Inpatient status is judged to be reasonable and necessary in order to provide the required intensity of service to ensure the patient's safety. The patient's presenting symptoms, physical exam findings, and initial radiographic and laboratory data in the context of their chronic comorbidities is felt to place them at high risk for further clinical deterioration. Furthermore, it is not anticipated that the patient will be medically stable for discharge from the hospital within 2 midnights of admission.   * I certify that at the point of admission it is my clinical judgment that the patient will require inpatient hospital care spanning beyond 2 midnights from the point of admission due to high intensity of service, high risk for further deterioration and high frequency of surveillance required.*   For questions or updates, please contact [place] Please consult [email or web address] for contact info under     Signed, Lily Kocher, PA-C  02/19/2022 4:20 PM

## 2022-02-19 NOTE — ED Triage Notes (Signed)
Patient here with complaint of dizziness, weakness, and shortness of breath that started approximately three days ago. Patient denies pain. Patient is alert, oriented, speaking in complete sentences, and is in no apparent distress at this time.

## 2022-02-19 NOTE — Progress Notes (Signed)
Echocardiogram 2D Echocardiogram has been performed.  Oneal Deputy Hisayo Delossantos RDCS 02/19/2022, 4:22 PM  Dr Tamala Julian at bedside for stat

## 2022-02-20 ENCOUNTER — Telehealth (HOSPITAL_COMMUNITY): Payer: Self-pay | Admitting: Pharmacy Technician

## 2022-02-20 ENCOUNTER — Ambulatory Visit (HOSPITAL_COMMUNITY): Admit: 2022-02-20 | Payer: Medicare Other | Admitting: Cardiovascular Disease

## 2022-02-20 ENCOUNTER — Other Ambulatory Visit (HOSPITAL_COMMUNITY): Payer: Self-pay

## 2022-02-20 ENCOUNTER — Encounter (HOSPITAL_COMMUNITY)
Admission: EM | Disposition: A | Discharge status: Home / Self Care | Payer: Self-pay | Source: Home / Self Care | Attending: Interventional Cardiology

## 2022-02-20 DIAGNOSIS — I4891 Unspecified atrial fibrillation: Principal | ICD-10-CM

## 2022-02-20 DIAGNOSIS — I25118 Atherosclerotic heart disease of native coronary artery with other forms of angina pectoris: Secondary | ICD-10-CM | POA: Diagnosis not present

## 2022-02-20 DIAGNOSIS — R7989 Other specified abnormal findings of blood chemistry: Secondary | ICD-10-CM

## 2022-02-20 DIAGNOSIS — I5023 Acute on chronic systolic (congestive) heart failure: Secondary | ICD-10-CM | POA: Diagnosis not present

## 2022-02-20 DIAGNOSIS — I214 Non-ST elevation (NSTEMI) myocardial infarction: Secondary | ICD-10-CM | POA: Diagnosis not present

## 2022-02-20 DIAGNOSIS — I2102 ST elevation (STEMI) myocardial infarction involving left anterior descending coronary artery: Secondary | ICD-10-CM | POA: Diagnosis not present

## 2022-02-20 DIAGNOSIS — I484 Atypical atrial flutter: Secondary | ICD-10-CM | POA: Diagnosis not present

## 2022-02-20 DIAGNOSIS — I255 Ischemic cardiomyopathy: Secondary | ICD-10-CM

## 2022-02-20 HISTORY — PX: LEFT HEART CATH AND CORONARY ANGIOGRAPHY: CATH118249

## 2022-02-20 LAB — BASIC METABOLIC PANEL
Anion gap: 10 (ref 5–15)
BUN: 27 mg/dL — ABNORMAL HIGH (ref 8–23)
CO2: 22 mmol/L (ref 22–32)
Calcium: 8.9 mg/dL (ref 8.9–10.3)
Chloride: 100 mmol/L (ref 98–111)
Creatinine, Ser: 1.45 mg/dL — ABNORMAL HIGH (ref 0.44–1.00)
GFR, Estimated: 35 mL/min — ABNORMAL LOW (ref >60)
Glucose, Bld: 134 mg/dL — ABNORMAL HIGH (ref 70–99)
Potassium: 4.2 mmol/L (ref 3.5–5.1)
Sodium: 132 mmol/L — ABNORMAL LOW (ref 135–145)

## 2022-02-20 LAB — LIPID PANEL
Cholesterol: 142 mg/dL (ref 0–200)
HDL: 35 mg/dL — ABNORMAL LOW (ref >40)
LDL Cholesterol: 95 mg/dL (ref 0–99)
Total CHOL/HDL Ratio: 4.1 RATIO
Triglycerides: 58 mg/dL (ref <150)
VLDL: 12 mg/dL (ref 0–40)

## 2022-02-20 LAB — URINALYSIS, ROUTINE W REFLEX MICROSCOPIC
Bilirubin Urine: NEGATIVE
Glucose, UA: NEGATIVE mg/dL
Hgb urine dipstick: NEGATIVE
Ketones, ur: 5 mg/dL — AB
Nitrite: NEGATIVE
Protein, ur: 30 mg/dL — AB
Specific Gravity, Urine: 1.018 (ref 1.005–1.030)
pH: 5 (ref 5.0–8.0)

## 2022-02-20 LAB — HEPATIC FUNCTION PANEL
ALT: 17 U/L (ref 0–44)
AST: 30 U/L (ref 15–41)
Albumin: 2.9 g/dL — ABNORMAL LOW (ref 3.5–5.0)
Alkaline Phosphatase: 60 U/L (ref 38–126)
Bilirubin, Direct: 0.2 mg/dL (ref 0.0–0.2)
Indirect Bilirubin: 0.7 mg/dL (ref 0.3–0.9)
Total Bilirubin: 0.9 mg/dL (ref 0.3–1.2)
Total Protein: 5.9 g/dL — ABNORMAL LOW (ref 6.5–8.1)

## 2022-02-20 LAB — CBC
HCT: 34.7 % — ABNORMAL LOW (ref 36.0–46.0)
Hemoglobin: 11.3 g/dL — ABNORMAL LOW (ref 12.0–15.0)
MCH: 29 pg (ref 26.0–34.0)
MCHC: 32.6 g/dL (ref 30.0–36.0)
MCV: 89.2 fL (ref 80.0–100.0)
Platelets: 307 10*3/uL (ref 150–400)
RBC: 3.89 MIL/uL (ref 3.87–5.11)
RDW: 12.6 % (ref 11.5–15.5)
WBC: 8.7 10*3/uL (ref 4.0–10.5)
nRBC: 0 % (ref 0.0–0.2)

## 2022-02-20 LAB — HEPARIN LEVEL (UNFRACTIONATED)
Heparin Unfractionated: <0.1 IU/mL — ABNORMAL LOW (ref 0.30–0.70)
Heparin Unfractionated: 0.21 IU/mL — ABNORMAL LOW (ref 0.30–0.70)

## 2022-02-20 SURGERY — LEFT HEART CATH AND CORONARY ANGIOGRAPHY
Anesthesia: LOCAL

## 2022-02-20 MED ORDER — ATORVASTATIN CALCIUM 40 MG PO TABS: 40.0000 mg | ORAL_TABLET | Freq: Every day | ORAL | Status: DC

## 2022-02-20 MED ORDER — DILTIAZEM HCL-DEXTROSE 125-5 MG/125ML-% IV SOLN (PREMIX)
5.0000 mg/h | INTRAVENOUS | Status: DC
  Administered 2022-02-20: 5 mg/h via INTRAVENOUS
  Filled 2022-02-20: qty 125

## 2022-02-20 MED ORDER — DIGOXIN 125 MCG PO TABS: 0.0625 mg | ORAL_TABLET | Freq: Every day | ORAL | Status: DC

## 2022-02-20 MED ORDER — SODIUM CHLORIDE 0.9 % IV SOLN: INTRAVENOUS | Status: AC

## 2022-02-20 MED ORDER — MIDAZOLAM HCL 2 MG/2ML IJ SOLN
INTRAMUSCULAR | Status: AC
  Filled 2022-02-20: qty 2

## 2022-02-20 MED ORDER — ACETAMINOPHEN 325 MG PO TABS: 650.0000 mg | ORAL_TABLET | ORAL | Status: DC | PRN

## 2022-02-20 MED ORDER — HEPARIN (PORCINE) IN NACL 1000-0.9 UT/500ML-% IV SOLN
INTRAVENOUS | Status: AC
  Filled 2022-02-20: qty 1000

## 2022-02-20 MED ORDER — HEPARIN (PORCINE) 25000 UT/250ML-% IV SOLN
1350.0000 [IU]/h | INTRAVENOUS | Status: DC
  Administered 2022-02-20: 1200 [IU]/h via INTRAVENOUS
  Filled 2022-02-20: qty 250

## 2022-02-20 MED ORDER — LIDOCAINE HCL (PF) 1 % IJ SOLN
INTRAMUSCULAR | Status: AC
  Filled 2022-02-20: qty 30

## 2022-02-20 MED ORDER — HEPARIN SODIUM (PORCINE) 1000 UNIT/ML IJ SOLN
INTRAMUSCULAR | Status: DC | PRN
  Administered 2022-02-20: 4000 [IU] via INTRAVENOUS

## 2022-02-20 MED ORDER — SODIUM CHLORIDE 0.9% FLUSH: 3.0000 mL | INTRAVENOUS | Status: DC | PRN

## 2022-02-20 MED ORDER — SODIUM CHLORIDE 0.9 % IV SOLN: 250.0000 mL | INTRAVENOUS | Status: DC | PRN

## 2022-02-20 MED ORDER — METOPROLOL TARTRATE 12.5 MG HALF TABLET
12.5000 mg | ORAL_TABLET | Freq: Two times a day (BID) | ORAL | Status: DC
  Administered 2022-02-20 – 2022-02-21 (×3): 12.5 mg via ORAL
  Filled 2022-02-20 (×3): qty 1

## 2022-02-20 MED ORDER — LABETALOL HCL 5 MG/ML IV SOLN: 10.0000 mg | INTRAVENOUS | Status: AC | PRN

## 2022-02-20 MED ORDER — FUROSEMIDE 10 MG/ML IJ SOLN
40.0000 mg | Freq: Once | INTRAMUSCULAR | Status: AC
  Administered 2022-02-20: 40 mg via INTRAVENOUS
  Filled 2022-02-20: qty 4

## 2022-02-20 MED ORDER — AMIODARONE HCL IN DEXTROSE 360-4.14 MG/200ML-% IV SOLN
60.0000 mg/h | INTRAVENOUS | Status: AC
  Administered 2022-02-20 (×2): 60 mg/h via INTRAVENOUS
  Filled 2022-02-20 (×2): qty 200

## 2022-02-20 MED ORDER — MIDAZOLAM HCL 2 MG/2ML IJ SOLN
INTRAMUSCULAR | Status: DC | PRN
  Administered 2022-02-20: 1 mg via INTRAVENOUS

## 2022-02-20 MED ORDER — VERAPAMIL HCL 2.5 MG/ML IV SOLN
INTRAVENOUS | Status: AC
  Filled 2022-02-20: qty 2

## 2022-02-20 MED ORDER — LIVING BETTER WITH HEART FAILURE BOOK: Freq: Once | Status: DC

## 2022-02-20 MED ORDER — ASPIRIN 81 MG PO CHEW: 81.0000 mg | CHEWABLE_TABLET | Freq: Every day | ORAL | Status: DC

## 2022-02-20 MED ORDER — SODIUM CHLORIDE 0.9% FLUSH
3.0000 mL | Freq: Two times a day (BID) | INTRAVENOUS | Status: DC
  Administered 2022-02-20 – 2022-02-24 (×9): 3 mL via INTRAVENOUS

## 2022-02-20 MED ORDER — HEPARIN SODIUM (PORCINE) 1000 UNIT/ML IJ SOLN
INTRAMUSCULAR | Status: AC
  Filled 2022-02-20: qty 10

## 2022-02-20 MED ORDER — ONDANSETRON HCL 4 MG/2ML IJ SOLN: 4.0000 mg | Freq: Four times a day (QID) | INTRAMUSCULAR | Status: DC | PRN

## 2022-02-20 MED ORDER — HEPARIN BOLUS VIA INFUSION
1500.0000 [IU] | Freq: Once | INTRAVENOUS | Status: AC
  Administered 2022-02-20: 1500 [IU] via INTRAVENOUS
  Filled 2022-02-20: qty 1500

## 2022-02-20 MED ORDER — AMIODARONE LOAD VIA INFUSION
150.0000 mg | Freq: Once | INTRAVENOUS | Status: AC
  Administered 2022-02-20: 150 mg via INTRAVENOUS
  Filled 2022-02-20: qty 83.34

## 2022-02-20 MED ORDER — AMIODARONE HCL IN DEXTROSE 360-4.14 MG/200ML-% IV SOLN
30.0000 mg/h | INTRAVENOUS | Status: DC
  Administered 2022-02-20 – 2022-02-22 (×2): 30 mg/h via INTRAVENOUS
  Filled 2022-02-20 (×4): qty 200

## 2022-02-20 MED ORDER — METOPROLOL TARTRATE 25 MG PO TABS: 25.0000 mg | ORAL_TABLET | Freq: Two times a day (BID) | ORAL | Status: DC

## 2022-02-20 MED ORDER — VERAPAMIL HCL 2.5 MG/ML IV SOLN
INTRAVENOUS | Status: DC | PRN
  Administered 2022-02-20: 10 mL via INTRA_ARTERIAL

## 2022-02-20 MED ORDER — FUROSEMIDE 10 MG/ML IJ SOLN: 40.0000 mg | Freq: Once | INTRAMUSCULAR | Status: DC

## 2022-02-20 MED ORDER — DIGOXIN 0.25 MG/ML IJ SOLN
0.1250 mg | Freq: Every day | INTRAMUSCULAR | Status: AC
  Administered 2022-02-20: 0.125 mg via INTRAVENOUS
  Filled 2022-02-20: qty 0.5

## 2022-02-20 MED ORDER — LIDOCAINE HCL (PF) 1 % IJ SOLN
INTRAMUSCULAR | Status: DC | PRN
  Administered 2022-02-20: 2 mL via INTRADERMAL

## 2022-02-20 MED ORDER — HYDRALAZINE HCL 20 MG/ML IJ SOLN: 10.0000 mg | INTRAMUSCULAR | Status: AC | PRN

## 2022-02-20 MED ORDER — HEPARIN (PORCINE) IN NACL 1000-0.9 UT/500ML-% IV SOLN
INTRAVENOUS | Status: DC | PRN
  Administered 2022-02-20 (×2): 500 mL

## 2022-02-20 MED ORDER — IOHEXOL 350 MG/ML SOLN
INTRAVENOUS | Status: DC | PRN
  Administered 2022-02-20: 30 mL via INTRA_ARTERIAL

## 2022-02-20 SURGICAL SUPPLY — 11 items
CATH OPTITORQUE TIG 4.0 5F (CATHETERS) IMPLANT
GLIDESHEATH SLEND SS 6F .021 (SHEATH) IMPLANT
GUIDEWIRE INQWIRE 1.5J.035X260 (WIRE) IMPLANT
INQWIRE 1.5J .035X260CM (WIRE) ×1
KIT HEART LEFT (KITS) ×1 IMPLANT
PACK CARDIAC CATHETERIZATION (CUSTOM PROCEDURE TRAY) ×1 IMPLANT
SHEATH PROBE COVER 6X72 (BAG) IMPLANT
SYR MEDRAD MARK 7 150ML (SYRINGE) ×1 IMPLANT
TRANSDUCER W/STOPCOCK (MISCELLANEOUS) ×2 IMPLANT
TUBING CIL FLEX 10 FLL-RA (TUBING) ×1 IMPLANT
WIRE HI TORQ VERSACORE-J 145CM (WIRE) IMPLANT

## 2022-02-20 NOTE — Telephone Encounter (Signed)
Pharmacy Patient Advocate Encounter  Insurance verification completed.    The patient is insured through [employer]   The patient is currently admitted and ran test claims for the following: Eliquis, Xarelto, Entresto, Jardiance, Farxiga.  Copays and coinsurance results were relayed to Inpatient clinical team.

## 2022-02-20 NOTE — TOC Benefit Eligibility Note (Signed)
Patient Teacher, English as a foreign language completed.    The patient is currently admitted and upon discharge could be taking Eliquis 5 mg.  The current 30 day co-pay is $38.00.   The patient is currently admitted and upon discharge could be taking Xarelto 20 mg.  The current 30 day co-pay is $38.00.   The patient is currently admitted and upon discharge could be taking Jardiance 10 mg.  The current 30 day co-pay is $38.00.   The patient is currently admitted and upon discharge could be taking Entresto 24-26 mg.  The current 30 day co-pay is $38.00.   The patient is currently admitted and upon discharge could be taking Farxiga 10 mg.  Requires Prior Authorization  The patient is insured through [place] ([place] only one of [employer] that take [employer])     Lyndel Safe, [place] Patient Advocate Specialist [place] Patient Advocate Team Direct Number: [phone]  Fax: [phone]

## 2022-02-20 NOTE — Progress Notes (Signed)
[place] for heparin Indication: Aflutter, r/o ACS  Allergies  Allergen Reactions   Bromfenac Other (See Comments)    Hives, welts   Lisinopril     Other Reaction(s): arthralgias   Meloxicam Other (See Comments)    unknown   Tramadol Hcl     Other Reaction(s): ineffective   Valacyclovir     GI Upset    Patient Measurements: Height: 5' 2.75" (159.4 cm) Weight: 81.2 kg (179 lb) IBW/kg (Calculated) : 51.83 Heparin Dosing Weight: 69.7kg  Vital Signs: Temp: 98.2 F (36.8 C) (11/01 2330) Temp Source: Oral (11/01 2330) BP: 86/60 (11/02 0015) Pulse Rate: 88 (11/01 2330)  Labs: Recent Labs    02/19/22 1212 02/19/22 1430 02/20/22 0027  HGB 12.2  --  11.3*  HCT 37.1  --  34.7*  PLT 347  --  307  HEPARINUNFRC  --   --  <0.10*  CREATININE 1.46*  --  1.45*  TROPONINIHS 2,400* 2,268*  --      Estimated Creatinine Clearance: 26.9 mL/min (A) (by C-G formula based on SCr of 1.45 mg/dL (H)).   Medical History: History reviewed. No pertinent past medical history.  Medications:  Infusions:   sodium chloride     Followed by   sodium chloride     diltiazem (CARDIZEM) infusion 5 mg/hr (02/20/22 0025)   heparin 850 Units/hr (02/20/22 0000)    Assessment: 89 yof presented to the ED with dizziness and SOB. Found to be in afib and now starting IV heparin. Baseline CBC is WNL. She is not on anticoagulation PTA.   Heparin gtt initially started for AF per pharmacy consult. Cardiology has asked to change to ACS heparin dosing for now.   11/2 AM update:  Heparin level low  Goal of Therapy:  Heparin level 0.3-0.7 units/ml Monitor platelets by anticoagulation protocol: Yes   Plan:  Heparin 1500 units bolus Inc heparin to 1000 units/hr 1000 heparin level  Narda Bonds, PharmD, BCPS Clinical Pharmacist Phone: [phone]

## 2022-02-20 NOTE — Progress Notes (Signed)
Mobility Specialist Progress Note    02/20/22 0919  Mobility  Activity Ambulated with assistance in room  Level of Assistance Minimal assist, patient does 75% or more  Assistive Device Other (Comment) (HHA)  Distance Ambulated (ft) 10 ft  Activity Response Tolerated fair  Mobility Referral Yes  $Mobility charge 1 Mobility   During Mobility: 146 HR Post-Mobility: 137 HR  Pt received on BSC after successful BM. C/o being SOB once in chair. Left with call bell in reach, RN notified.   Hildred Alamin Mobility Specialist  Secure Chat Only

## 2022-02-20 NOTE — Progress Notes (Signed)
   02/20/22 1200  Assess: MEWS Score  BP 127/69  MAP (mmHg) 81  Pulse Rate (!) 131  ECG Heart Rate (!) 134  Resp (!) 32  Level of Consciousness Alert  SpO2 90 %  O2 Device Room Air  Assess: MEWS Score  MEWS Temp 0  MEWS Systolic 0  MEWS Pulse 3  MEWS RR 2  MEWS LOC 0  MEWS Score 5  MEWS Score Color Red  Assess: if the MEWS score is Yellow or Red  Were vital signs taken at a resting state? Yes  Focused Assessment Change from prior assessment (see assessment flowsheet)  Does the patient meet 2 or more of the SIRS criteria? Yes  Does the patient have a confirmed or suspected source of infection? No  MEWS guidelines implemented *See Row Information* Yes  Treat  MEWS Interventions Escalated (See documentation below)  Pain Scale 0-10  Pain Score 0  Take Vital Signs  Increase Vital Sign Frequency  Red: Q 1hr X 4 then Q 4hr X 4, if remains red, continue Q 4hrs  Escalate  MEWS: Escalate Red: discuss with charge nurse/RN and provider, consider discussing with RRT  Notify: Charge Nurse/RN  Name of Charge Nurse/RN Notified April Cooper, RN  Date Charge Nurse/RN Notified 02/20/22  Time Charge Nurse/RN Notified 1201  Notify: Provider  Provider Name/Title Dr. Tamala Julian  Date Provider Notified 02/20/22  Time Provider Notified 1202  Method of Notification Face-to-face  Notification Reason Change in status  Provider response At bedside;See new orders  Date of Provider Response 02/20/22  Time of Provider Response 1202  Document  Patient Outcome Not stable and remains on department  Assess: SIRS CRITERIA  SIRS Temperature  0  SIRS Pulse 1  SIRS Respirations  1  SIRS WBC 1  SIRS Score Sum  3

## 2022-02-20 NOTE — Progress Notes (Signed)
   Heart Failure Stewardship Pharmacist Progress Note   PCP: Donnajean Lopes, MD PCP-Cardiologist: None    HPI:   [name] is an 88 yo female who presented with complaints of dizziness, weakness, and shortness of breath that started approximately three days prior to admission. Patient was found to have an NSTEMI with troponin elevated to 2400. Patient underwent left heart cath today, cath note is pending, but appears cardiology is planning medical management at this time with DAPT. Her stay has been complicated by atrial fibrillation with RVR, treated initially with diltiazem, amiodarone, and digoxin. Echocardiogram on 02/19/22 noted LVEF of 25-30%. Diltiazem and digoxin were discontinued. She was short of breath today. No lower extremity edema on exam.   Current HF Medications: Beta Blocker: metoprolol tartrate 12.5 mg BID  Prior to admission HF Medications: ACE/ARB/ARNI: olmesartan 40 mg daily  Pertinent Lab Values: Serum creatinine 1.45, BUN 27, Potassium 4.2, Sodium 132, BNP 1668.8   Vital Signs: Weight: 175.1 lbs (admission weight: 179 lbs stated) Blood pressure: 120/60s  Heart rate: 100s  I/O: +0.05L yesterday; net + 0.1L  Medication Assistance / Insurance Benefits Check: Does the patient have prescription insurance?  Yes Type of insurance plan: [place]:  Prior to admission outpatient pharmacy: [facility] in [place] Is the patient willing to use [place] at discharge? Yes Is the patient willing to transition their outpatient pharmacy to utilize a [facility] outpatient pharmacy?   No    Assessment: 1. Acute on chronic systolic CHF (LVEF 59-93%), due to ICM. NYHA class IV symptoms. - Given recent cath and elevated creatinine, will consider GDMT titration and addition based on AM labs - Continue strict I/Os and maintain K >4 and Mg >2   Plan: 1) Medication changes recommended at this time: - None at this time  2) Patient  assistance: - Copay for Ferne Coe, Eliquis, and Xarelto is $38 each  3)  Education  - To be completed prior to discharge  Thank you for allowing pharmacy to participate in this patient's care.  Reatha Harps, PharmD PGY2 Pharmacy Resident 02/20/2022 1:00 PM Check [email or web address] for unit specific pharmacy number

## 2022-02-20 NOTE — Progress Notes (Signed)
Heart Failure Navigator Progress Note  Following this hospitalization to assess for HV TOC readiness.   EF 25-30%  NSTEMI Left Heart Cath 11/2  Earnestine Leys, BSN, RN Heart Failure Nurse Navigator Secure Chat Only

## 2022-02-20 NOTE — Progress Notes (Signed)
Stop by after cath to discuss findings with patient. Significant increase in heart rate occurred with wide-complex tachycardia likely related to rate related conduction abnormality. Diltiazem has been discontinued.  Will start IV amiodarone bolus and infusion for rate control and possibly pharmacologic conversion.  We will discontinue any further doses of digoxin.  At this point she has only gotten 0.125 mg IV. Continue anticoagulation and will switch to Eliquis therapy in a.m. Would be to continue IV amiodarone and possibly consider TEE guided cardioversion prior to discharge.

## 2022-02-20 NOTE — Progress Notes (Addendum)
   Notified by RN that patient was confused and hypoxic requiring 3 L supplemental oxygen via [place] (patient was not on supplemental oxygen prior to cath)  On my discussion with patient, she is SOB while sitting in the bed. She denies chest pain or pain near right radial cath site. States that she woke up and briefly thought that she was at home in her own bed. After a few moments, she remembered she was in the hospital. Patient is now oriented and alert.   On exam, patient has crackles in bilateral lung bases and has occasional wheezing. Denies regular use of any inhalers. Patient has been receiving IV fluids pre and post cath. EF 25-30% on echo this admission. I have ordered a 1 time dose of lasix 40 mg IV. Agree with stopping post-cath fluids. Also asked Almyra Deforest PA-C to check on patient in about 2 hours for improvement.   Margie Billet, PA-C 02/20/2022 4:17 PM

## 2022-02-20 NOTE — Progress Notes (Signed)
ANTICOAGULATION CONSULT NOTE  Pharmacy Consult for heparin Indication: atrial fibrillation  Allergies  Allergen Reactions   Bromfenac Other (See Comments)    Hives, welts   Lisinopril     Other Reaction(s): arthralgias   Meloxicam Other (See Comments)    unknown   Tramadol Hcl     Other Reaction(s): ineffective   Valacyclovir     GI Upset    Patient Measurements: Height: 5' 2.75" (159.4 cm) Weight: 79.4 kg (175 lb 1.6 oz) IBW/kg (Calculated) : 51.83 Heparin Dosing Weight: 69.7kg  Vital Signs: Temp: 97.8 F (36.6 C) (11/02 1214) Temp Source: Oral (11/02 1214) BP: 127/69 (11/02 1214) Pulse Rate: 105 (11/02 1214)  Labs: Recent Labs    02/19/22 1212 02/19/22 1430 02/20/22 0027 02/20/22 1005  HGB 12.2  --  11.3*  --   HCT 37.1  --  34.7*  --   PLT 347  --  307  --   HEPARINUNFRC  --   --  <0.10* 0.21*  CREATININE 1.46*  --  1.45*  --   TROPONINIHS 2,400* 2,268*  --   --      Estimated Creatinine Clearance: 26.6 mL/min (A) (by C-G formula based on SCr of 1.45 mg/dL (H)).   Medical History: History reviewed. No pertinent past medical history.  Medications:  Infusions:   sodium chloride 50 mL/hr at 02/20/22 1221   sodium chloride     amiodarone 60 mg/hr (02/20/22 1253)   Followed by   amiodarone     heparin 1,000 Units/hr (02/20/22 0340)    Assessment: 46 yoF admitted with SOB and AFib. Pt started on IV heparin per pharmacy.  Pt s/p LHC, ok to resume heparin drip no bolus 8h after radial sheath removal (1115) per Dr. Claiborne Billings.  Heparin level subtherapeutic at 0.21 this morning on 1000 units/h.  Goal of Therapy:  Heparin level 0.3-0.7 units/ml Monitor platelets by anticoagulation protocol: Yes   Plan:  Start heparin 1200 units/h no bolus at 1900 Check heparin level 8h after resuming  Arrie Senate, PharmD, BCPS, [facility] Clinical Pharmacist [phone] Please check AMION for all [place] numbers 02/20/2022

## 2022-02-20 NOTE — Progress Notes (Signed)
Progress Note; Pt feels well in 2C. No chest pain or SOB. Soft hematoma with mild ecchymosis above and below the Zephyr sheath. No pain or paresthesias.   [name], MD  3:26 PM

## 2022-02-20 NOTE — Progress Notes (Signed)
Patient's SOB improved, lung is clear on exam. Notified patient to contact nurse overnight if become SOB again.

## 2022-02-20 NOTE — Progress Notes (Addendum)
The patient has been seen in conjunction with Lily Kocher, PA-C. All aspects of care have been considered and discussed. The patient has been personally interviewed, examined, and all clinical data has been reviewed.  Still in atrial fibrillation/flutter.  She is now off IV diltiazem because of soft blood pressures.  Metoprolol will be uptitrated as tolerated by blood pressure.  Digoxin is added today for better rate control and systolic dysfunction in the setting of prior anterolateral septal infarction. In Cath Lab now with Dr. Claiborne Billings.  Right coronary demonstrates collaterals to the LAD. Medical therapy of systolic heart failure, aggressive guideline directed therapy for systolic heart failure possibly to include an SGLT2 and ARB/ARNI if tolerated by blood pressure. If she is able to tolerate rate control for atrial flutter, we will keep it simple otherwise may need to consult EP to consider ablation. Long discussion with the patient last evening leaves me feeling that she wants a less aggressive approach rather than invasive procedures if at all possible due to her age.   Rounding Note    Patient Name: [name] Date of Encounter: 02/20/2022  [place] Cardiologist: None   Subjective   Patient resting in bed, tired appearing but in no acute distress. Denies overnight chest pain, shortness of breath, palpitations. Discussed plans for LHC today and answered all questions.  Inpatient Medications    Scheduled Meds:  aspirin EC  81 mg Oral Daily   aspirin  325 mg Oral Daily   atorvastatin  80 mg Oral Daily   metoprolol tartrate  12.5 mg Oral BID   Continuous Infusions:  sodium chloride 1 mL/kg/hr (02/20/22 0555)   diltiazem (CARDIZEM) infusion 5 mg/hr (02/20/22 0400)   heparin 1,000 Units/hr (02/20/22 0340)   PRN Meds: acetaminophen, nitroGLYCERIN, ondansetron (ZOFRAN) IV   Vital Signs    Vitals:   02/20/22 0200 02/20/22 0230 02/20/22 0345 02/20/22 0802   BP: (!) 95/57 (!) 91/57 (!) 128/116 127/86  Pulse:   77   Resp:   20 17  Temp:   98.7 F (37.1 C) 98.7 F (37.1 C)  TempSrc:   Oral Oral  SpO2:   98% 98%  Weight:   79.4 kg   Height:        Intake/Output Summary (Last 24 hours) at 02/20/2022 0809 Last data filed at 02/20/2022 0340 Gross per 24 hour  Intake 238.28 ml  Output 100 ml  Net 138.28 ml      02/20/2022    3:45 AM 02/19/2022    2:18 PM  Last 3 Weights  Weight (lbs) 175 lb 1.6 oz 179 lb  Weight (kg) 79.425 kg 81.194 kg      Telemetry    Rate controlled atrial flutter with variable conduction- Personally Reviewed  ECG    Atrial flutter, variable conduction - Personally Reviewed  Physical Exam   GEN: No acute distress.   Neck: No JVD Cardiac: RRR, no murmurs, rubs, or gallops.  Respiratory: Clear to auscultation bilaterally. GI: Soft, nontender, non-distended  MS: No edema; No deformity. Neuro:  Nonfocal  Psych: Normal affect   Labs    High Sensitivity Troponin:   Recent Labs  Lab 02/19/22 1212 02/19/22 1430  TROPONINIHS 2,400* 2,268*     Chemistry Recent Labs  Lab 02/19/22 1212 02/20/22 0027  NA 136 132*  K 4.4 4.2  CL 103 100  CO2 22 22  GLUCOSE 115* 134*  BUN 22 27*  CREATININE 1.46* 1.45*  CALCIUM 9.1 8.9  PROT  --  5.9*  ALBUMIN  --  2.9*  AST  --  30  ALT  --  17  ALKPHOS  --  60  BILITOT  --  0.9  GFRNONAA 34* 35*  ANIONGAP 11 10    Lipids  Recent Labs  Lab 02/20/22 0027  CHOL 142  TRIG 58  HDL 35*  LDLCALC 95  CHOLHDL 4.1    Hematology Recent Labs  Lab 02/19/22 1212 02/20/22 0027  WBC 9.1 8.7  RBC 4.15 3.89  HGB 12.2 11.3*  HCT 37.1 34.7*  MCV 89.4 89.2  MCH 29.4 29.0  MCHC 32.9 32.6  RDW 12.5 12.6  PLT 347 307   Thyroid  Recent Labs  Lab 02/19/22 1212  TSH 4.135    BNP Recent Labs  Lab 02/19/22 1212  BNP 1,168.8*    DDimer No results for input(s): "DDIMER" in the last 168 hours.   Radiology    ECHOCARDIOGRAM COMPLETE  Result Date:  02/19/2022    ECHOCARDIOGRAM REPORT   Patient Name:   CATARINA HUNTLEY Date of Exam: 02/19/2022 Medical Rec #:  [id number]       Height:       62.8 in Accession #:    [id number]      Weight:       179.0 lb Date of Birth:  11-09-1931       BSA:          1.840 m Patient Age:    64 years        BP:           109/76 mmHg Patient Gender: F               HR:           106 bpm. Exam Location:  Inpatient Procedure: 2D Echo, Color Doppler, Cardiac Doppler and Intracardiac            Opacification Agent STAT ECHO Indications:    Elevated Troponins  History:        Patient has no prior history of Echocardiogram examinations.                 Risk Factors:Hypertension.  Sonographer:    Raquel Sarna Senior RDCS Referring Phys: [id number] [place]  1. Left ventricular ejection fraction, by estimation, is 25 to 30%. The left ventricle has severely decreased function. The left ventricle demonstrates regional wall motion abnormalities with mid to apical anteroseptal and anterior, apical inferior, apical lateral, and true apex akinesis. No LV thrombus noted. Findings consistent with LAD territory infarction. Left ventricular diastolic parameters are indeterminate.  2. Right ventricular systolic function is normal. The right ventricular size is normal. Tricuspid regurgitation signal is inadequate for assessing PA pressure.  3. Left atrial size was mildly dilated.  4. The mitral valve is normal in structure. Trivial mitral valve regurgitation. No evidence of mitral stenosis.  5. The aortic valve is normal in structure. Aortic valve regurgitation is not visualized. No aortic stenosis is present.  6. The inferior vena cava is normal in size with <50% respiratory variability, suggesting right atrial pressure of 8 mmHg.  7. The patient was in atrial fibrillation. FINDINGS  Left Ventricle: Left ventricular ejection fraction, by estimation, is 25 to 30%. The left ventricle has severely decreased function. The left ventricle demonstrates  regional wall motion abnormalities. Definity contrast agent was given IV to delineate the left ventricular endocardial borders. The left ventricular internal cavity size was normal in size. There is no left  ventricular hypertrophy. Left ventricular diastolic parameters are indeterminate. Right Ventricle: The right ventricular size is normal. No increase in right ventricular wall thickness. Right ventricular systolic function is normal. Tricuspid regurgitation signal is inadequate for assessing PA pressure. Left Atrium: Left atrial size was mildly dilated. Right Atrium: Right atrial size was normal in size. Pericardium: There is no evidence of pericardial effusion. Mitral Valve: The mitral valve is normal in structure. Mild mitral annular calcification. Trivial mitral valve regurgitation. No evidence of mitral valve stenosis. Tricuspid Valve: The tricuspid valve is normal in structure. Tricuspid valve regurgitation is not demonstrated. Aortic Valve: The aortic valve is normal in structure. Aortic valve regurgitation is not visualized. No aortic stenosis is present. Pulmonic Valve: The pulmonic valve was normal in structure. Pulmonic valve regurgitation is not visualized. Aorta: The aortic root is normal in size and structure. Venous: The inferior vena cava is normal in size with less than 50% respiratory variability, suggesting right atrial pressure of 8 mmHg. IAS/Shunts: No atrial level shunt detected by color flow Doppler.  LEFT VENTRICLE PLAX 2D LVOT diam:     2.10 cm LV SV:         60 LV SV Index:   33 LVOT Area:     3.46 cm  RIGHT VENTRICLE RV S prime:     9.57 cm/s TAPSE (M-mode): 1.6 cm LEFT ATRIUM             Index        RIGHT ATRIUM           Index LA Vol (A2C):   52.4 ml 28.48 ml/m  RA Area:     11.30 cm LA Vol (A4C):   44.8 ml 24.35 ml/m  RA Volume:   23.70 ml  12.88 ml/m LA Biplane Vol: 52.7 ml 28.65 ml/m  AORTIC VALVE LVOT Vmax:   108.00 cm/s LVOT Vmean:  75.800 cm/s LVOT VTI:    0.173 m  AORTA Ao  Root diam: 2.40 cm  SHUNTS Systemic VTI:  0.17 m Systemic Diam: 2.10 cm Dalton McleanMD Electronically signed by Franki Monte Signature Date/Time: 02/19/2022/4:36:36 PM    Final    DG Chest 2 View  Result Date: 02/19/2022 CLINICAL DATA:  Provided history: Chest pain. Additional history provided: Shortness of breath and chest pressure. History of hypertension. EXAM: CHEST - 2 VIEW COMPARISON:  Radiographs 03/12/2010 and earlier. FINDINGS: Heart size within normal limits. Aortic atherosclerosis. Prominence of the hila bilaterally, increased from the prior examination of 03/12/2010. 4 mm nodular opacity projecting in the region of the right upper lobe on the PA radiograph. Small ill-defined opacities within the bilateral lung bases, likely reflecting atelectasis. No evidence of pleural effusion or pneumothorax. Degenerative changes of the spine. IMPRESSION: Prominence of the hila bilaterally, increased from the prior examination of 03/12/2010. Although nonspecific, this finding can reflect hilar lymphadenopathy and a contrast-enhanced chest CT is recommended for further evaluation. 4 mm nodular opacity projecting in the region of the right upper lobe on the PA radiograph. This may reflect a small pulmonary nodule. Attention recommended at time of CT follow-up. Prominence of the interstitial lung markings. Findings may reflect chronic interstitial changes and/or interstitial edema. Mild bibasilar atelectasis. Electronically Signed   By: Kellie Simmering D.O.   On: 02/19/2022 12:49    Cardiac Studies   02/20/2022 TTE  IMPRESSIONS     1. Left ventricular ejection fraction, by estimation, is 25 to 30%. The  left ventricle has severely decreased function. The left ventricle  demonstrates  regional wall motion abnormalities with mid to apical  anteroseptal and anterior, apical inferior,  apical lateral, and true apex akinesis. No LV thrombus noted. Findings  consistent with LAD territory infarction. Left  ventricular diastolic  parameters are indeterminate.   2. Right ventricular systolic function is normal. The right ventricular  size is normal. Tricuspid regurgitation signal is inadequate for assessing  PA pressure.   3. Left atrial size was mildly dilated.   4. The mitral valve is normal in structure. Trivial mitral valve  regurgitation. No evidence of mitral stenosis.   5. The aortic valve is normal in structure. Aortic valve regurgitation is  not visualized. No aortic stenosis is present.   6. The inferior vena cava is normal in size with <50% respiratory  variability, suggesting right atrial pressure of 8 mmHg.   7. The patient was in atrial fibrillation.   FINDINGS   Left Ventricle: Left ventricular ejection fraction, by estimation, is 25  to 30%. The left ventricle has severely decreased function. The left  ventricle demonstrates regional wall motion abnormalities. Definity  contrast agent was given IV to delineate  the left ventricular endocardial borders. The left ventricular internal  cavity size was normal in size. There is no left ventricular hypertrophy.  Left ventricular diastolic parameters are indeterminate.   Right Ventricle: The right ventricular size is normal. No increase in  right ventricular wall thickness. Right ventricular systolic function is  normal. Tricuspid regurgitation signal is inadequate for assessing PA  pressure.   Left Atrium: Left atrial size was mildly dilated.   Right Atrium: Right atrial size was normal in size.   Pericardium: There is no evidence of pericardial effusion.   Mitral Valve: The mitral valve is normal in structure. Mild mitral annular  calcification. Trivial mitral valve regurgitation. No evidence of mitral  valve stenosis.   Tricuspid Valve: The tricuspid valve is normal in structure. Tricuspid  valve regurgitation is not demonstrated.   Aortic Valve: The aortic valve is normal in structure. Aortic valve  regurgitation is  not visualized. No aortic stenosis is present.   Pulmonic Valve: The pulmonic valve was normal in structure. Pulmonic valve  regurgitation is not visualized.   Aorta: The aortic root is normal in size and structure.   Venous: The inferior vena cava is normal in size with less than 50%  respiratory variability, suggesting right atrial pressure of 8 mmHg.   IAS/Shunts: No atrial level shunt detected by color flow Doppler.    Patient Profile     CALANDRA MADURA is a 88 y.o. female with hx hypertension, hyperlipidemia, intermediate uveitis non granulomatous who is being seen 02/19/2022 for the evaluation of new onset atrial fibrillation/flutter.   Assessment & Plan    Fatigue Dysnpea   Patient with remarkably little medical history for her age presents in the setting of unusual fatigue and dyspnea.  Troponin notably elevated: 2400 -> 2268.  EKG concerning for prior anterior MI with poor R wave progression.  Given elevated LDL, suspect that patient may have undiagnosed CAD.   Transthoracic echocardiogram with LVEF 25-30%, mid to apical anteroseptal and anterior, apical inferior, apical lateral, and true apex akinesis. Findings consistent with LAD territory infarction. Continue metoprolol tartrate 12.5 twice daily. Titrate up as able. Continue heparin Aspirin 324 mg ordered Chest x-ray without any obvious vascular congestion or cardiomegaly, but will check BNP  New onset atrial fibrillation/atrial flutter with variable conduction   ECG tracings in the emergency department show irregularly irregular rhythm.  Appears to be atrial flutter with variable conduction, occasionally 2:1 conduction pattern. Patient with overall improved rate control today though occasionally runs up into the 140s.   Continue diltiazem infusion for rate control. Titrate off as able in the setting of HFrEF. Continue Metoprolol 12.'5mg'$  BID, titrate up as able.  Digoxin 0.142mg IV given this morning for additional  rate control. Do not want rates to be a barrier to [facility] Plan to continue 0.0625 mcg daily PO starting 11/3.  May need to consider antiarrhythmic drug therapy or cardioversion following LHC. Continue heparin (ACS protocol). CHA2DS2-VASc Score = 4.  Likely to need ongoing anticoagulation.     HFrEF Presumed ischemic cardiomyopathy  Patient without significant cardiac history noted with LVEF 25-30% on TTE in the setting of troponin elevation to 2400. LHC planned for today. BNP 1168. Will need initiation of GDMT following catheterization, currently on BB only. On physical exam today, appears generally euvolemic.    AKI  Patient with creatinine elevated to 1.45, baseline appears to be ~0.9. There may be a pre-renal component with decreased EF and lower BP. Continue to monitor closely following cath and avoid nephrotoxic agents.   Hyperlipidemia   Patient not on lipid lowering therapy. Atorvastatin '80mg'$  initiated. Baseline LFTs WNL.  Lipid Panel     Component Value Date/Time   CHOL 142 02/20/2022 0027   TRIG 58 02/20/2022 0027   HDL 35 (L) 02/20/2022 0027   CHOLHDL 4.1 02/20/2022 0027   VLDL 12 02/20/2022 0027   LDLCALC 95 02/20/2022 0027       For questions or updates, please contact CAngelicaPlease consult [email or web address] for contact info under        Signed, ELily Kocher PA-C  02/20/2022, 8:09 AM

## 2022-02-20 NOTE — Interval H&P Note (Signed)
Cath Lab Visit (complete for each Cath Lab visit)  Clinical Evaluation Leading to the Procedure:   ACS: Yes.    Non-ACS:    Anginal Classification: CCS I  Anti-ischemic medical therapy: Minimal Therapy (1 class of medications)  Non-Invasive Test Results: No non-invasive testing performed  Prior CABG: No previous CABG      History and Physical Interval Note:  02/20/2022 10:31 AM  [name]  has presented today for surgery, with the diagnosis of nstemi.  The various methods of treatment have been discussed with the patient and family. After consideration of risks, benefits and other options for treatment, the patient has consented to  Procedure(s): LEFT HEART CATH AND CORONARY ANGIOGRAPHY (N/A) as a surgical intervention.  The patient's history has been reviewed, patient examined, no change in status, stable for surgery.  I have reviewed the patient's chart and labs.  Questions were answered to the patient's satisfaction.     [name]

## 2022-02-21 ENCOUNTER — Encounter (HOSPITAL_COMMUNITY): Payer: Self-pay | Admitting: Cardiovascular Disease

## 2022-02-21 ENCOUNTER — Inpatient Hospital Stay (HOSPITAL_COMMUNITY): Payer: Medicare Other

## 2022-02-21 DIAGNOSIS — I4891 Unspecified atrial fibrillation: Secondary | ICD-10-CM

## 2022-02-21 DIAGNOSIS — R7989 Other specified abnormal findings of blood chemistry: Secondary | ICD-10-CM | POA: Diagnosis not present

## 2022-02-21 DIAGNOSIS — I255 Ischemic cardiomyopathy: Secondary | ICD-10-CM | POA: Diagnosis not present

## 2022-02-21 DIAGNOSIS — I214 Non-ST elevation (NSTEMI) myocardial infarction: Secondary | ICD-10-CM | POA: Diagnosis not present

## 2022-02-21 LAB — MAGNESIUM: Magnesium: 1.8 mg/dL (ref 1.7–2.4)

## 2022-02-21 LAB — CBC
HCT: 35.6 % — ABNORMAL LOW (ref 36.0–46.0)
Hemoglobin: 12.3 g/dL (ref 12.0–15.0)
MCH: 29.6 pg (ref 26.0–34.0)
MCHC: 34.6 g/dL (ref 30.0–36.0)
MCV: 85.8 fL (ref 80.0–100.0)
Platelets: 339 10*3/uL (ref 150–400)
RBC: 4.15 MIL/uL (ref 3.87–5.11)
RDW: 12.5 % (ref 11.5–15.5)
WBC: 10.3 10*3/uL (ref 4.0–10.5)
nRBC: 0 % (ref 0.0–0.2)

## 2022-02-21 LAB — BASIC METABOLIC PANEL
Anion gap: 10 (ref 5–15)
Anion gap: 9 (ref 5–15)
BUN: 22 mg/dL (ref 8–23)
BUN: 19 mg/dL (ref 8–23)
CO2: 24 mmol/L (ref 22–32)
CO2: 23 mmol/L (ref 22–32)
Calcium: 9 mg/dL (ref 8.9–10.3)
Calcium: 8.7 mg/dL — ABNORMAL LOW (ref 8.9–10.3)
Chloride: 100 mmol/L (ref 98–111)
Chloride: 99 mmol/L (ref 98–111)
Creatinine, Ser: 1.29 mg/dL — ABNORMAL HIGH (ref 0.44–1.00)
Creatinine, Ser: 1.21 mg/dL — ABNORMAL HIGH (ref 0.44–1.00)
GFR, Estimated: 40 mL/min — ABNORMAL LOW (ref >60)
GFR, Estimated: 43 mL/min — ABNORMAL LOW (ref >60)
Glucose, Bld: 121 mg/dL — ABNORMAL HIGH (ref 70–99)
Glucose, Bld: 116 mg/dL — ABNORMAL HIGH (ref 70–99)
Potassium: 4.5 mmol/L (ref 3.5–5.1)
Potassium: 3.9 mmol/L (ref 3.5–5.1)
Sodium: 134 mmol/L — ABNORMAL LOW (ref 135–145)
Sodium: 131 mmol/L — ABNORMAL LOW (ref 135–145)

## 2022-02-21 LAB — TSH: TSH: 5.67 u[IU]/mL — ABNORMAL HIGH (ref 0.350–4.500)

## 2022-02-21 LAB — HEPARIN LEVEL (UNFRACTIONATED): Heparin Unfractionated: 0.12 IU/mL — ABNORMAL LOW (ref 0.30–0.70)

## 2022-02-21 LAB — LIPOPROTEIN A (LPA): Lipoprotein (a): 22.1 nmol/L (ref <75.0)

## 2022-02-21 MED ORDER — MAGNESIUM SULFATE 2 GM/50ML IV SOLN
2.0000 g | Freq: Once | INTRAVENOUS | Status: AC
  Administered 2022-02-21: 2 g via INTRAVENOUS
  Filled 2022-02-21: qty 50

## 2022-02-21 MED ORDER — METOPROLOL TARTRATE 25 MG PO TABS
25.0000 mg | ORAL_TABLET | Freq: Two times a day (BID) | ORAL | Status: AC
  Administered 2022-02-21 – 2022-02-22 (×3): 25 mg via ORAL
  Filled 2022-02-21 (×3): qty 1

## 2022-02-21 MED ORDER — APIXABAN 5 MG PO TABS
5.0000 mg | ORAL_TABLET | Freq: Two times a day (BID) | ORAL | Status: DC
  Administered 2022-02-21 – 2022-02-24 (×7): 5 mg via ORAL
  Filled 2022-02-21 (×7): qty 1

## 2022-02-21 NOTE — Discharge Instructions (Signed)
Pharmacy recommendations to change Losartan 25 once daily to Entresto 24/26 mg BID. Will discuss with patient on the follow up visit to the clinic.  Information on my medicine - ELIQUIS (apixaban)  This medication education was reviewed with me or my healthcare representative as part of my discharge preparation.  The pharmacist that spoke with me during my hospital stay was:  Mosetta Anis, [facility]  Why was Eliquis prescribed for you? Eliquis was prescribed for you to reduce the risk of a blood clot forming that can cause a stroke if you have a medical condition called atrial fibrillation (a type of irregular heartbeat).  What do You need to know about Eliquis ? Take your Eliquis TWICE DAILY - one tablet in the morning and one tablet in the evening with or without food. If you have difficulty swallowing the tablet whole please discuss with your pharmacist how to take the medication safely.  Take Eliquis exactly as prescribed by your doctor and DO NOT stop taking Eliquis without talking to the doctor who prescribed the medication.  Stopping may increase your risk of developing a stroke.  Refill your prescription before you run out.  After discharge, you should have regular check-up appointments with your healthcare provider that is prescribing your Eliquis.  In the future your dose may need to be changed if your kidney function or weight changes by a significant amount or as you get older.  What do you do if you miss a dose? If you miss a dose, take it as soon as you remember on the same day and resume taking twice daily.  Do not take more than one dose of ELIQUIS at the same time to make up a missed dose.  Important Safety Information A possible side effect of Eliquis is bleeding. You should call your healthcare provider right away if you experience any of the following: Bleeding from an injury or your nose that does not stop. Unusual colored urine (red or dark brown) or unusual  colored stools (red or black). Unusual bruising for unknown reasons. A serious fall or if you hit your head (even if there is no bleeding).  Some medicines may interact with Eliquis and might increase your risk of bleeding or clotting while on Eliquis. To help avoid this, consult your healthcare provider or pharmacist prior to using any new prescription or non-prescription medications, including herbals, vitamins, non-steroidal anti-inflammatory drugs (NSAIDs) and supplements.  This website has more information on Eliquis (apixaban): http://www.eliquis.com/eliquis/home

## 2022-02-21 NOTE — TOC Initial Note (Signed)
Transition of Care [facility]) - Initial/Assessment Note    Patient Details  Name: [name] MRN: [id number] Date of Birth: [birth year]  Transition of Care [facility]) CM/SW Contact:    Marilu Favre, RN Phone Number: 02/21/2022, 1:23 PM  Clinical Narrative:                  Patient from home alone. Patient has 2 sons that live close by.   Confirmed face sheet information.   Patient has transportation to appointments.   Patient currently on oxygen. Patient does not have oxygen at home. If needed at discharge will need ambulatory saturation note and order.   PCP is DR Leanna Battles.   Pharmacy has done benefit checks.    Expected Discharge Plan: Home/Self Care Barriers to Discharge: Continued Medical Work up   Patient Goals and CMS Choice Patient states their goals for this hospitalization and ongoing recovery are:: to return to home   Choice offered to / list presented to : NA  Expected Discharge Plan and Services Expected Discharge Plan: Home/Self Care   Discharge Planning Services: CM Consult   Living arrangements for the past 2 months: Single Family Home                 DME Arranged: N/A         HH Arranged: NA          Prior Living Arrangements/Services Living arrangements for the past 2 months: Single Family Home Lives with:: Self Patient language and need for interpreter reviewed:: Yes Do you feel safe going back to the place where you live?: Yes      Need for Family Participation in Patient Care: Yes (Comment) Care giver support system in place?: Yes (comment) Current home services: DME Criminal Activity/Legal Involvement Pertinent to Current Situation/Hospitalization: No - Comment as needed  Activities of Daily Living      Permission Sought/Granted   Permission granted to share information with : No              Emotional Assessment Appearance:: Appears stated age Attitude/Demeanor/Rapport: Engaged Affect (typically observed):  Accepting Orientation: : Oriented to Self, Oriented to Place, Oriented to  Time, Oriented to Situation Alcohol / Substance Use: Not Applicable Psych Involvement: No (comment)  Admission diagnosis:  Elevated troponin [R79.89] NSTEMI (non-ST elevated myocardial infarction) ([place]) [I21.4] Atrial fibrillation with RVR ([facility]) [I48.91] Patient Active Problem List   Diagnosis Date Noted   Atrial fibrillation with RVR ([place]) 02/20/2022   Elevated troponin 02/20/2022   Ischemic cardiomyopathy 02/20/2022   NSTEMI (non-ST elevated myocardial infarction) ([place]) 02/19/2022   PCP:  Donnajean Lopes, MD Pharmacy:   [facility] [place], [place] - [address] [place] [address] Phone: [phone] Fax: [phone]     Social Determinants of Health (SDOH) Interventions Food Insecurity Interventions: Intervention Not Indicated Housing Interventions: Intervention Not Indicated Transportation Interventions: Intervention Not Indicated Utilities Interventions: Intervention Not Indicated Alcohol Usage Interventions: Intervention Not Indicated (Score <7) Financial Strain Interventions: Intervention Not Indicated  Readmission Risk Interventions     No data to display

## 2022-02-21 NOTE — Progress Notes (Signed)
ANTICOAGULATION CONSULT NOTE  Pharmacy Consult for heparin Indication: atrial fibrillation  Allergies  Allergen Reactions   Bromfenac Other (See Comments)    Hives, welts   Lisinopril     Other Reaction(s): arthralgias   Meloxicam Other (See Comments)    unknown   Tramadol Hcl     Other Reaction(s): ineffective   Valacyclovir     GI Upset    Patient Measurements: Height: 5' 2.75" (159.4 cm) Weight: 79.4 kg (175 lb 1.6 oz) IBW/kg (Calculated) : 51.83 Heparin Dosing Weight: 69.7kg  Vital Signs: Temp: 98.3 F (36.8 C) (11/03 0330) Temp Source: Oral (11/03 0330) BP: 137/64 (11/03 0330) Pulse Rate: 73 (11/03 0330)  Labs: Recent Labs    02/19/22 1212 02/19/22 1430 02/20/22 0027 02/20/22 1005 02/21/22 0506  HGB 12.2  --  11.3*  --  12.3  HCT 37.1  --  34.7*  --  35.6*  PLT 347  --  307  --  339  HEPARINUNFRC  --   --  <0.10* 0.21* 0.12*  CREATININE 1.46*  --  1.45*  --   --   TROPONINIHS 2,400* 2,268*  --   --   --      Estimated Creatinine Clearance: 26.6 mL/min (A) (by C-G formula based on SCr of 1.45 mg/dL (H)).   Medical History: History reviewed. No pertinent past medical history.  Medications:  Infusions:   sodium chloride     amiodarone 30 mg/hr (02/20/22 2204)   heparin 1,200 Units/hr (02/20/22 2115)    Assessment: 64 yoF admitted with SOB and AFib. Pt started on IV heparin per pharmacy.  Pt s/p LHC, ok to resume heparin drip no bolus 8h after radial sheath removal (1115) per Dr. Claiborne Billings.  11/3 AM update:  Heparin level sub-therapeutic   Goal of Therapy:  Heparin level 0.3-0.7 units/ml Monitor platelets by anticoagulation protocol: Yes   Plan:  Inc heparin to 1350 units/hr 1400 heparin level  Narda Bonds, PharmD, BCPS Clinical Pharmacist Phone: [phone]

## 2022-02-21 NOTE — Progress Notes (Signed)
Heart Failure Nurse Navigator Progress Note  PCP: Donnajean Lopes, MD PCP-Cardiologist: None Admission Diagnosis: Atrial fibrillation with RVR, Elevated troponin Admitted from: Home  Presentation:   [name] presented with shortness of breath, fatigue, dizziness, for 3 days, bilateral lower extremity edema, fEKG ound to be in A-Fib with RVR. BNP 1,168, Trop 2,400, Left heart cath done on 11/ 2 showed 100 stenosis of proximal to distal LAD. GDMT therapy recommended per Cardiology and patients preference for no aggressive interventions.   Patient was educated on the sign and symptoms of heart failure, daily weights, when to call her doctor or go to the ED, Diet/ fluid restrictions, taking all her medications as prescribed and attending her medical appointments. Patient stated she does try to watch her salt in take and drink water and coffee only, she usually weight herself daily , however she can start doing that, she has a pill box that she uses and she doesn't miss taking any . Patient voiced her understanding of the education provided. Patient stated that she will have one of her children bring her to her appointment. Patient is scheduled for a hospital HF TOC appointment on 03/11/2022 @ 9 am.   ECHO/ LVEF: 25-30%   Clinical Course:  History reviewed. No pertinent past medical history.   Social History   Socioeconomic History   Marital status: Widowed    Spouse name: Not on file   Number of children: 7   Years of education: Not on file   Highest education level: High school graduate  Occupational History   Occupation: Retired  Tobacco Use   Smoking status: Former    Years: 3.00    Types: Cigarettes   Smokeless tobacco: Not on file  Vaping Use   Vaping Use: Never used  Substance and Sexual Activity   Alcohol use: Never   Drug use: Never   Sexual activity: Not on file  Other Topics Concern   Not on file  Social History Narrative   Not on file   Social Determinants  of Health   Financial Resource Strain: Low Risk  (02/21/2022)   Overall Financial Resource Strain (CARDIA)    Difficulty of Paying Living Expenses: Not very hard  Food Insecurity: No Food Insecurity (02/21/2022)   Hunger Vital Sign    Worried About Running Out of Food in the Last Year: Never true    Ran Out of Food in the Last Year: Never true  Transportation Needs: No Transportation Needs (02/21/2022)   PRAPARE - Hydrologist (Medical): No    Lack of Transportation (Non-Medical): No  Physical Activity: Not on file  Stress: Not on file  Social Connections: Not on file   Education Assessment and Provision:  Detailed education and instructions provided on heart failure disease management including the following:  Signs and symptoms of Heart Failure When to call the physician Importance of daily weights Low sodium diet Fluid restriction Medication management Anticipated future follow-up appointments  Patient education given on each of the above topics.  Patient acknowledges understanding via teach back method and acceptance of all instructions.  Education Materials:  "Living Better With Heart Failure" Booklet, HF zone tool, & Daily Weight Tracker Tool.  Patient has scale at home: yes Patient has pill box at home: yes    High Risk Criteria for Readmission and/or Poor Patient Outcomes: Heart failure hospital admissions (last 6 months): 0  No Show rate: 0 Difficult social situation: No Demonstrates medication adherence: yes  Primary Language: English Literacy level: Reading, writing , and comprehension  Barriers of Care:   Diet/ fluid restrictions Daily weights  Considerations/Referrals:   Referral made to Heart Failure Pharmacist Stewardship: Yes Referral made to Heart Failure CSW/NCM TOC: No Referral made to Heart & Vascular TOC clinic: Yes, 03/11/2022 @ 9 am   Items for Follow-up on DC/TOC: Diet/ fluid restrictions Daily weights   Earnestine Leys, BSN, RN Heart Failure Transport planner Only

## 2022-02-21 NOTE — Plan of Care (Signed)

## 2022-02-21 NOTE — Progress Notes (Addendum)
   Heart Failure Stewardship Pharmacist Progress Note   PCP: Donnajean Lopes, MD PCP-Cardiologist: None   HPI:   Ms. Caruthers is an 88 yo female who presented with complaints of dizziness, weakness, and shortness of breath that started approximately three days prior to admission. Patient was found to have an NSTEMI with troponin elevated to 2400. Patient underwent left heart cath 02/20/22 revealing 100% stenosis of proximal to distal LAD with faint collaterals from the inferior septal perforating arteries and circumflex artery. Cardiology is planning medical management of CHF and CAD given patient preference for no aggressive interventions. Her stay has been complicated by atrial fibrillation with RVR, treated initially with diltiazem, amiodarone, and digoxin. Echocardiogram on 02/19/22 noted LVEF of 25-30%. Diltiazem and digoxin were discontinued. Cardiology plans for DCCV early next week.   Current HF Medications: Beta Blocker: metoprolol tartrate 12.5 mg BID  Prior to admission HF Medications: ACE/ARB/ARNI: olmesartan 40 mg daily  Pertinent Lab Values: Serum creatinine 1.29, BUN 27, Potassium 4.5, Sodium 134, BNP 1668.8 (11/1), Magnesium 1.8   Vital Signs: Weight: 174.8 lbs (admission weight: 179 lbs stated) Blood pressure: 130/60s  Heart rate: 90s, remains in afib  I/O: -0.78L yesterday; net -0.82L  Medication Assistance / Insurance Benefits Check: Does the patient have prescription insurance?  Yes Type of insurance plan: [place]:  Prior to admission outpatient pharmacy: [facility] in [place] Is the patient willing to use [place] at discharge? Yes Is the patient willing to transition their outpatient pharmacy to utilize a [facility] outpatient pharmacy?   No    Assessment: 1. Acute on chronic systolic CHF (LVEF 41-74%), due to ICM. NYHA class IV symptoms. - Creatinine is improved and BP is stable, can consider the addition of a low dose ARB  or Entresto - Patient will need to consolidate metoprolol tartrate to succinate before discharge - Continue strict I/Os and maintain K >4 and Mg >2   Plan: 1) Medication changes recommended at this time: - Start Entresto 24-26 mg twice daily. - Consolidate metoprolol tartrate to succinate for HFrEF when able  2) Patient assistance: - Copay for Ferne Coe, Eliquis, and Xarelto is $38 each  3)  Education  - To be completed prior to discharge  Thank you for allowing pharmacy to participate in this patient's care.  Reatha Harps, PharmD PGY2 Pharmacy Resident 02/21/2022 7:51 AM Check [email or web address] for unit specific pharmacy number

## 2022-02-21 NOTE — Progress Notes (Addendum)
The patient has been seen in conjunction with Zack Seal, MD. All aspects of care have been considered and discussed. The patient has been personally interviewed, examined, and all clinical data has been reviewed.  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  2.)  Atrial fibrillation with rapid ventricular response in the setting of systolic heart failure. Coronary angiography demonstrates totally occluded LAD with infarct of undetermined age.  Echo EF is less than 30% with anteroapical wall motion abnormality.  LVEDP was normal.  Goal is institution of strong guideline directed therapy for systolic heart failure once atrial fibrillation is under better control and blood pressure more supportive. Atrial fibrillation developed within 2 hours after admission to the hospital.  Initial EKG in normal sinus rhythm demonstrated evidence of poor R wave progression and suspicious for anterior infarction of undetermined age with persistent ST elevation raising question of aneurysm.  Rate control for atrial fibrillation has been difficult.  IV amiodarone has been started.  Diltiazem has been discontinued and beta-blocker therapy started.  Beta-blocker therapy should be further uptitrated as a component of GDMT for systolic heart failure.  The goal is to achieve sinus rhythm.  Plan is IV amiodarone with electrical cardioversion early next week if she does not pharmacologically convert.  This will need to be scheduled. Patient is not certain if she will allow [place] to do electrical cardioversion but will talk to her family. She will need long-term anticoagulation. Other clinically relevant diagnoses include age and OBESITY. Not sure if OSA could be involved. Mild hyponatremia related to heart failure.   Rounding Note    Patient Name: [name] Date  of Encounter: 02/21/2022  [place] Cardiologist: None   Subjective   Still experiences shortness of breath with minimal exertion. No chest pain. Able to tolerate lying flat.  Inpatient Medications    Scheduled Meds:  aspirin EC  81 mg Oral Daily   atorvastatin  80 mg Oral Daily   metoprolol tartrate  12.5 mg Oral BID   sodium chloride flush  3 mL Intravenous Q12H   Continuous Infusions:  sodium chloride     amiodarone 30 mg/hr (02/20/22 2204)   heparin 1,350 Units/hr (02/21/22 0540)   magnesium sulfate bolus IVPB     PRN Meds: sodium chloride, acetaminophen, nitroGLYCERIN, ondansetron (ZOFRAN) IV, sodium chloride flush   Vital Signs    Vitals:   02/20/22 2345 02/21/22 0330 02/21/22 0615 02/21/22 0755  BP: 135/62 137/64  132/60  Pulse: 78 73  92  Resp: (!) 24 (!) 24  (!) 27  Temp: 98.4 F (36.9 C) 98.3 F (36.8 C)  97.6 F (36.4 C)  TempSrc: Oral Oral  Oral  SpO2: 90%   93%  Weight:   79.3 kg   Height:        Intake/Output Summary (Last 24 hours) at 02/21/2022 0810 Last data filed at 02/21/2022 0756 Gross per 24 hour  Intake 683.43 ml  Output 1650 ml  Net -966.57 ml      02/21/2022    6:15 AM 02/20/2022    3:45 AM 02/19/2022    2:18 PM  Last 3 Weights  Weight (lbs) 174 lb 12.8 oz 175 lb 1.6 oz 179 lb  Weight (kg) 79.289 kg 79.425 kg 81.194 kg      Telemetry    Atrial fibrillation with RVR - Personally Reviewed  Physical Exam  GEN: No acute distress.   Neck: Hepatojugular reflux. Minimal JVD. Cardiac: Irregular tachycardia Respiratory: Bilateral crackles. GI: Soft, nontender, non-distended  MS: No edema; No deformity. Neuro:  Nonfocal  Psych: Normal affect   Labs    High Sensitivity Troponin:   Recent Labs  Lab 02/19/22 1212 02/19/22 1430  TROPONINIHS 2,400* 2,268*     Chemistry Recent Labs  Lab 02/19/22 1212 02/20/22 0027 02/21/22 0506  NA 136 132* 134*  K 4.4 4.2 4.5  CL 103 100 100  CO2 '22 22 24  '$ GLUCOSE 115* 134*  121*  BUN 22 27* 22  CREATININE 1.46* 1.45* 1.29*  CALCIUM 9.1 8.9 9.0  MG  --   --  1.8  PROT  --  5.9*  --   ALBUMIN  --  2.9*  --   AST  --  30  --   ALT  --  17  --   ALKPHOS  --  60  --   BILITOT  --  0.9  --   GFRNONAA 34* 35* 40*  ANIONGAP '11 10 10    '$ Lipids  Recent Labs  Lab 02/20/22 0027  CHOL 142  TRIG 58  HDL 35*  LDLCALC 95  CHOLHDL 4.1    Hematology Recent Labs  Lab 02/19/22 1212 02/20/22 0027 02/21/22 0506  WBC 9.1 8.7 10.3  RBC 4.15 3.89 4.15  HGB 12.2 11.3* 12.3  HCT 37.1 34.7* 35.6*  MCV 89.4 89.2 85.8  MCH 29.4 29.0 29.6  MCHC 32.9 32.6 34.6  RDW 12.5 12.6 12.5  PLT 347 307 339   Thyroid  Recent Labs  Lab 02/21/22 0506  TSH 5.670*    BNP Recent Labs  Lab 02/19/22 1212  BNP 1,168.8*    DDimer No results for input(s): "DDIMER" in the last 168 hours.   Radiology    CARDIAC CATHETERIZATION  Result Date: 02/20/2022   Mid LM to Dist LM lesion is 20% stenosed.   Prox LAD to Mid LAD lesion is 100% stenosed.   Mid LAD to Dist LAD lesion is 100% stenosed.   Ost Cx to Prox Cx lesion is 20% stenosed.   1st Mrg lesion is 30% stenosed.   Mid Cx lesion is 20% stenosed.   Dist Cx lesion is 20% stenosed. Probable recent total occlusion of the proximal LAD after a small proximal septal perforating artery.  There is faint collateralization via inferior septal perforating arteries to LAD proximal to mid septals as well as from the circumflex marginal to distal LAD. Mild nonobstructive disease involving the distal left main, and left circumflex coronary artery. Normal dominant RCA. LVEDP 13 mmHg. RECOMMENDATION: I suspect the patient's myocardial infarction has occurred over the past 1-1/2 to 2 weeks with onset of her progressive shortness of breath symptomatology.  On echocardiography EF is 25 to 30% with LAD infarction wall motion abnormalities.  Recommend guideline directed medical therapy for CAD and HFrEF.  Aggressive lipid-lowering therapy.    ECHOCARDIOGRAM COMPLETE  Result Date: 02/19/2022    ECHOCARDIOGRAM REPORT   Patient Name:   [name] Date of Exam: 02/19/2022 Medical Rec #:  [id number]       Height:       62.8 in Accession #:    [id number]      Weight:       179.0 lb Date of Birth:  11-Apr-1932       BSA:          1.840 m Patient Age:    88  years        BP:           109/76 mmHg Patient Gender: F               HR:           106 bpm. Exam Location:  Inpatient Procedure: 2D Echo, Color Doppler, Cardiac Doppler and Intracardiac            Opacification Agent STAT ECHO Indications:    Elevated Troponins  History:        Patient has no prior history of Echocardiogram examinations.                 Risk Factors:Hypertension.  Sonographer:    Raquel Sarna Senior RDCS Referring Phys: [id number] [place]  1. Left ventricular ejection fraction, by estimation, is 25 to 30%. The left ventricle has severely decreased function. The left ventricle demonstrates regional wall motion abnormalities with mid to apical anteroseptal and anterior, apical inferior, apical lateral, and true apex akinesis. No LV thrombus noted. Findings consistent with LAD territory infarction. Left ventricular diastolic parameters are indeterminate.  2. Right ventricular systolic function is normal. The right ventricular size is normal. Tricuspid regurgitation signal is inadequate for assessing PA pressure.  3. Left atrial size was mildly dilated.  4. The mitral valve is normal in structure. Trivial mitral valve regurgitation. No evidence of mitral stenosis.  5. The aortic valve is normal in structure. Aortic valve regurgitation is not visualized. No aortic stenosis is present.  6. The inferior vena cava is normal in size with <50% respiratory variability, suggesting right atrial pressure of 8 mmHg.  7. The patient was in atrial fibrillation. FINDINGS  Left Ventricle: Left ventricular ejection fraction, by estimation, is 25 to 30%. The left ventricle has severely decreased  function. The left ventricle demonstrates regional wall motion abnormalities. Definity contrast agent was given IV to delineate the left ventricular endocardial borders. The left ventricular internal cavity size was normal in size. There is no left ventricular hypertrophy. Left ventricular diastolic parameters are indeterminate. Right Ventricle: The right ventricular size is normal. No increase in right ventricular wall thickness. Right ventricular systolic function is normal. Tricuspid regurgitation signal is inadequate for assessing PA pressure. Left Atrium: Left atrial size was mildly dilated. Right Atrium: Right atrial size was normal in size. Pericardium: There is no evidence of pericardial effusion. Mitral Valve: The mitral valve is normal in structure. Mild mitral annular calcification. Trivial mitral valve regurgitation. No evidence of mitral valve stenosis. Tricuspid Valve: The tricuspid valve is normal in structure. Tricuspid valve regurgitation is not demonstrated. Aortic Valve: The aortic valve is normal in structure. Aortic valve regurgitation is not visualized. No aortic stenosis is present. Pulmonic Valve: The pulmonic valve was normal in structure. Pulmonic valve regurgitation is not visualized. Aorta: The aortic root is normal in size and structure. Venous: The inferior vena cava is normal in size with less than 50% respiratory variability, suggesting right atrial pressure of 8 mmHg. IAS/Shunts: No atrial level shunt detected by color flow Doppler.  LEFT VENTRICLE PLAX 2D LVOT diam:     2.10 cm LV SV:         60 LV SV Index:   33 LVOT Area:     3.46 cm  RIGHT VENTRICLE RV S prime:     9.57 cm/s TAPSE (M-mode): 1.6 cm LEFT ATRIUM             Index  RIGHT ATRIUM           Index LA Vol (A2C):   52.4 ml 28.48 ml/m  RA Area:     11.30 cm LA Vol (A4C):   44.8 ml 24.35 ml/m  RA Volume:   23.70 ml  12.88 ml/m LA Biplane Vol: 52.7 ml 28.65 ml/m  AORTIC VALVE LVOT Vmax:   108.00 cm/s LVOT Vmean:   75.800 cm/s LVOT VTI:    0.173 m  AORTA Ao Root diam: 2.40 cm  SHUNTS Systemic VTI:  0.17 m Systemic Diam: 2.10 cm Dalton McleanMD Electronically signed by Franki Monte Signature Date/Time: 02/19/2022/4:36:36 PM    Final    DG Chest 2 View  Result Date: 02/19/2022 CLINICAL DATA:  Provided history: Chest pain. Additional history provided: Shortness of breath and chest pressure. History of hypertension. EXAM: CHEST - 2 VIEW COMPARISON:  Radiographs 03/12/2010 and earlier. FINDINGS: Heart size within normal limits. Aortic atherosclerosis. Prominence of the hila bilaterally, increased from the prior examination of 03/12/2010. 4 mm nodular opacity projecting in the region of the right upper lobe on the PA radiograph. Small ill-defined opacities within the bilateral lung bases, likely reflecting atelectasis. No evidence of pleural effusion or pneumothorax. Degenerative changes of the spine. IMPRESSION: Prominence of the hila bilaterally, increased from the prior examination of 03/12/2010. Although nonspecific, this finding can reflect hilar lymphadenopathy and a contrast-enhanced chest CT is recommended for further evaluation. 4 mm nodular opacity projecting in the region of the right upper lobe on the PA radiograph. This may reflect a small pulmonary nodule. Attention recommended at time of CT follow-up. Prominence of the interstitial lung markings. Findings may reflect chronic interstitial changes and/or interstitial edema. Mild bibasilar atelectasis. Electronically Signed   By: Kellie Simmering D.O.   On: 02/19/2022 12:49    Cardiac Studies   LHC 02/20/22: 100% stenosis of proximal and mid segment of LAD.  Echo 02/19/22: LVEF 25-30% Anteroseptal and apical akinesis.  Patient Profile     88 y.o. female with history of hypertension presenting with DOE and fatigue, found to be in A-fib with RVR with elevated troponin and BNP with finding of 100% stenosis of proximal to distal LAD on LHC.  Assessment & Plan     A-fib versus A-flutter with RVR Presented in sinus rhythm, then flipped to A-fib/flutter while hospitalized. AC started immediately and has been continued. Rate remains in 110s with occasional runs into 140s. Hoping for conversion with pharmacologic measures. Continue amiodarone infusion for now. Will increase metoprolol tartrate to 25 mg BID. Will reach out to pharmacy regarding switch to a DOAC from heparin gtt. Want to ensure there is no gaps in [facility] coverage as this patient may be headed for electrical cardioversion if pharmacologic measures are unsuccessful over the weekend. -Increase metoprolol to 25 mg BID -Amiodarone infusion -Heparin gtt, to be transitioned to apixaban  NSTEMI HFrEF due to ischemic cardiomyopathy 100% stenosis of proximal to distal LAD. Remains dyspneic. Crackles on pulmonary exam, especially in R lung up to mid back. CXR with somewhat worsened pulmonary edema compared to prior study on 02/19/22. Hepatojugular reflux present but otherwise minimal JVD. -CXR -Continue aspirin and atorvastatin -Metoprolol as above -Add Entresto and SGLT-2 once a euvolemic state is achieved  AKI Stable. Creatinine improved somewhat today. Continue to monitor with daily BMP for now.  For questions or updates, please contact [place] Please consult [email or web address] for contact info under     Signed, Nani Gasser, MD  02/21/2022, 8:10 AM

## 2022-02-21 NOTE — Plan of Care (Signed)

## 2022-02-22 ENCOUNTER — Other Ambulatory Visit: Payer: Self-pay

## 2022-02-22 ENCOUNTER — Encounter (HOSPITAL_COMMUNITY): Payer: Self-pay | Admitting: Interventional Cardiology

## 2022-02-22 DIAGNOSIS — I4891 Unspecified atrial fibrillation: Secondary | ICD-10-CM | POA: Diagnosis not present

## 2022-02-22 DIAGNOSIS — I5041 Acute combined systolic (congestive) and diastolic (congestive) heart failure: Secondary | ICD-10-CM

## 2022-02-22 DIAGNOSIS — I214 Non-ST elevation (NSTEMI) myocardial infarction: Secondary | ICD-10-CM | POA: Diagnosis not present

## 2022-02-22 LAB — CBC
HCT: 32.7 % — ABNORMAL LOW (ref 36.0–46.0)
Hemoglobin: 11 g/dL — ABNORMAL LOW (ref 12.0–15.0)
MCH: 29.4 pg (ref 26.0–34.0)
MCHC: 33.6 g/dL (ref 30.0–36.0)
MCV: 87.4 fL (ref 80.0–100.0)
Platelets: 311 10*3/uL (ref 150–400)
RBC: 3.74 MIL/uL — ABNORMAL LOW (ref 3.87–5.11)
RDW: 12.6 % (ref 11.5–15.5)
WBC: 9.8 10*3/uL (ref 4.0–10.5)
nRBC: 0 % (ref 0.0–0.2)

## 2022-02-22 LAB — BASIC METABOLIC PANEL
Anion gap: 11 (ref 5–15)
BUN: 19 mg/dL (ref 8–23)
CO2: 23 mmol/L (ref 22–32)
Calcium: 8.8 mg/dL — ABNORMAL LOW (ref 8.9–10.3)
Chloride: 99 mmol/L (ref 98–111)
Creatinine, Ser: 1.22 mg/dL — ABNORMAL HIGH (ref 0.44–1.00)
GFR, Estimated: 43 mL/min — ABNORMAL LOW (ref >60)
Glucose, Bld: 112 mg/dL — ABNORMAL HIGH (ref 70–99)
Potassium: 4.2 mmol/L (ref 3.5–5.1)
Sodium: 133 mmol/L — ABNORMAL LOW (ref 135–145)

## 2022-02-22 LAB — MAGNESIUM: Magnesium: 2.2 mg/dL (ref 1.7–2.4)

## 2022-02-22 MED ORDER — EMPAGLIFLOZIN 10 MG PO TABS
10.0000 mg | ORAL_TABLET | Freq: Every day | ORAL | Status: DC
  Administered 2022-02-22 – 2022-02-24 (×3): 10 mg via ORAL
  Filled 2022-02-22 (×3): qty 1

## 2022-02-22 MED ORDER — METOPROLOL SUCCINATE ER 50 MG PO TB24
50.0000 mg | ORAL_TABLET | Freq: Every day | ORAL | Status: DC
  Administered 2022-02-23 – 2022-02-24 (×2): 50 mg via ORAL
  Filled 2022-02-22 (×2): qty 1

## 2022-02-22 MED ORDER — DAPAGLIFLOZIN PROPANEDIOL 10 MG PO TABS: 10.0000 mg | ORAL_TABLET | Freq: Every day | ORAL | Status: DC

## 2022-02-22 MED ORDER — AMIODARONE HCL 200 MG PO TABS
200.0000 mg | ORAL_TABLET | Freq: Two times a day (BID) | ORAL | Status: DC
  Administered 2022-02-22 – 2022-02-24 (×5): 200 mg via ORAL
  Filled 2022-02-22 (×5): qty 1

## 2022-02-22 MED ORDER — AMIODARONE HCL 200 MG PO TABS: 200.0000 mg | ORAL_TABLET | Freq: Every day | ORAL | Status: DC

## 2022-02-22 MED ORDER — SPIRONOLACTONE 12.5 MG HALF TABLET
12.5000 mg | ORAL_TABLET | Freq: Every day | ORAL | Status: DC
  Administered 2022-02-22 – 2022-02-24 (×3): 12.5 mg via ORAL
  Filled 2022-02-22 (×3): qty 1

## 2022-02-22 NOTE — Progress Notes (Signed)
Rounding Note    Patient Name: [name] Date of Encounter: 02/22/2022  [place] Cardiologist: None   Subjective   BP 113/86, Cr 1.22 Hgb 11.  Denies any chest pain or dyspnea  Inpatient Medications    Scheduled Meds:  apixaban  5 mg Oral BID   aspirin EC  81 mg Oral Daily   atorvastatin  80 mg Oral Daily   metoprolol tartrate  25 mg Oral BID   sodium chloride flush  3 mL Intravenous Q12H   Continuous Infusions:  sodium chloride     amiodarone 30 mg/hr (02/22/22 0427)   PRN Meds: sodium chloride, acetaminophen, nitroGLYCERIN, ondansetron (ZOFRAN) IV, sodium chloride flush   Vital Signs    Vitals:   02/21/22 2000 02/21/22 2123 02/21/22 2337 02/22/22 0351  BP: (!) 110/51 (!) 127/100 94/77 113/86  Pulse: 74 74  68  Resp: 20 18  (!) 22  Temp:   97.8 F (36.6 C) 97.7 F (36.5 C)  TempSrc:   Oral Oral  SpO2: 94% 95%  92%  Weight:      Height:        Intake/Output Summary (Last 24 hours) at 02/22/2022 0759 Last data filed at 02/22/2022 0035 Gross per 24 hour  Intake 616.6 ml  Output 500 ml  Net 116.6 ml      02/21/2022    6:15 AM 02/20/2022    3:45 AM 02/19/2022    2:18 PM  Last 3 Weights  Weight (lbs) 174 lb 12.8 oz 175 lb 1.6 oz 179 lb  Weight (kg) 79.289 kg 79.425 kg 81.194 kg      Telemetry    Normal sinus rhythm with PACs- Personally Reviewed  ECG    No new EKG- Personally Reviewed  Physical Exam   GEN: No acute distress.   Neck: No JVD Cardiac: Irregular, normal rate, no murmurs, rubs, or gallops.  Respiratory: Clear to auscultation bilaterally. GI: Soft, nontender, non-distended  MS: No edema; No deformity. Neuro:  Nonfocal  Psych: Normal affect   Labs    High Sensitivity Troponin:   Recent Labs  Lab 02/19/22 1212 02/19/22 1430  TROPONINIHS 2,400* 2,268*     Chemistry Recent Labs  Lab 02/20/22 0027 02/21/22 0506 02/21/22 1429 02/22/22 0035  NA 132* 134* 131* 133*  K 4.2 4.5 3.9 4.2  CL 100 100 99 99   CO2 '22 24 23 23  '$ GLUCOSE 134* 121* 116* 112*  BUN 27* '22 19 19  '$ CREATININE 1.45* 1.29* 1.21* 1.22*  CALCIUM 8.9 9.0 8.7* 8.8*  MG  --  1.8  --  2.2  PROT 5.9*  --   --   --   ALBUMIN 2.9*  --   --   --   AST 30  --   --   --   ALT 17  --   --   --   ALKPHOS 60  --   --   --   BILITOT 0.9  --   --   --   GFRNONAA 35* 40* 43* 43*  ANIONGAP '10 10 9 11    '$ Lipids  Recent Labs  Lab 02/20/22 0027  CHOL 142  TRIG 58  HDL 35*  LDLCALC 95  CHOLHDL 4.1    Hematology Recent Labs  Lab 02/20/22 0027 02/21/22 0506 02/22/22 0035  WBC 8.7 10.3 9.8  RBC 3.89 4.15 3.74*  HGB 11.3* 12.3 11.0*  HCT 34.7* 35.6* 32.7*  MCV 89.2 85.8 87.4  MCH 29.0  29.6 29.4  MCHC 32.6 34.6 33.6  RDW 12.6 12.5 12.6  PLT 307 339 311   Thyroid  Recent Labs  Lab 02/21/22 0506  TSH 5.670*    BNP Recent Labs  Lab 02/19/22 1212  BNP 1,168.8*    DDimer No results for input(s): "DDIMER" in the last 168 hours.   Radiology    DG CHEST PORT 1 VIEW  Result Date: 02/21/2022 CLINICAL DATA:  Pulmonary edema EXAM: PORTABLE CHEST 1 VIEW COMPARISON:  02/19/2022 FINDINGS: Single frontal view of the chest demonstrates a stable cardiac silhouette. Persistent bilateral hilar prominence which could reflect adenopathy or underlying pulmonary vascular congestion. Continued diffuse interstitial prominence, with developing bibasilar ground-glass airspace disease right greater than left. No effusion or pneumothorax. No acute bony abnormality. IMPRESSION: 1. Developing bibasilar airspace disease which could reflect edema, aspiration, or infection. 2. Persistent bilateral hilar prominence, which may be related to underlying adenopathy or vascular shadow. 3. The right upper lobe nodule seen on prior x-ray is not as well seen on this radiograph. If further evaluation is indicated, CT chest could be performed. Electronically Signed   By: Randa Ngo M.D.   On: 02/21/2022 13:16   CARDIAC CATHETERIZATION  Result Date:  02/20/2022   Mid LM to Dist LM lesion is 20% stenosed.   Prox LAD to Mid LAD lesion is 100% stenosed.   Mid LAD to Dist LAD lesion is 100% stenosed.   Ost Cx to Prox Cx lesion is 20% stenosed.   1st Mrg lesion is 30% stenosed.   Mid Cx lesion is 20% stenosed.   Dist Cx lesion is 20% stenosed. Probable recent total occlusion of the proximal LAD after a small proximal septal perforating artery.  There is faint collateralization via inferior septal perforating arteries to LAD proximal to mid septals as well as from the circumflex marginal to distal LAD. Mild nonobstructive disease involving the distal left main, and left circumflex coronary artery. Normal dominant RCA. LVEDP 13 mmHg. RECOMMENDATION: I suspect the patient's myocardial infarction has occurred over the past 1-1/2 to 2 weeks with onset of her progressive shortness of breath symptomatology.  On echocardiography EF is 25 to 30% with LAD infarction wall motion abnormalities.  Recommend guideline directed medical therapy for CAD and HFrEF.  Aggressive lipid-lowering therapy.    Cardiac Studies     Patient Profile     88 y.o. female with history of hypertension presenting with DOE and fatigue, found to be in A-fib with RVR with elevated troponin and BNP with finding of 100% stenosis of proximal to distal LAD on LHC.  Assessment & Plan     Atrial fibrillation: Was in sinus rhythm on presentation, developed A-fib/flutter during hospitalization.   -Continue metoprolol 25 mg twice daily -Continue Eliquis 5 mg twice daily -Converted to sinus rhythm on amiodarone drip.  Will transition to p.o. amiodarone, plan 20 mg twice daily x2 weeks then 200 mg daily  CAD: Presented with elevated troponin (2400 > 2268).  LHC 02/20/2022 showed occluded proximal to mid LAD, otherwise mild nonobstructive disease, LVEDP 13.  Suspected that patient's MI occurred over prior 2 weeks, medical management recommended.  She denies any anginal symptoms -Continue aspirin,  statin -Continue metoprolol 25 mg twice daily  Acute combined systolic and diastolic heart failure: Echo 02/19/2022 shows EF 25 to 30%, normal RV function, no significant valvular disease.  BNP 1168 on admission -Received IV Lasix 40 mg on 11/2, no Lasix yesterday.  Currently appears euvolemic. -Continue metoprolol 25 mg twice  daily, will consolidate to Toprol-XL prior to discharge -Add Jardiance 10 mg daily and spironolactone 12.5 mg daily.  If stable BP/renal function, plan to add losartan tomorrow  AKI: Creatinine 1.46 on presentation.  Improved with diuresis, 1.22 today  Disposition: Consult PT/OT  -Diet heart healthy -DVT PPx: Eliquis -Code: Full  For questions or updates, please contact [place] Please consult [email or web address] for contact info under        Signed, Donato Heinz, MD  02/22/2022, 7:59 AM

## 2022-02-22 NOTE — Progress Notes (Signed)
Mobility Specialist Progress Note    02/22/22 1258  Mobility  Activity Ambulated with assistance in hallway  Level of Assistance Minimal assist, patient does 75% or more  Assistive Device Front wheel walker  Distance Ambulated (ft) 150 ft  Activity Response Tolerated well  Mobility Referral Yes  $Mobility charge 1 Mobility   Pre-Mobility: 65 HR, 106/57 (71) BP, 93% SpO2 During Mobility: 81 HR Post-Mobility: 69 HR, 126/49 (72) BP, 96% SpO2  Pt received in bed and agreeable. Had void on BSC. No complaints on walk. Tolerated on RA when pleth reliable. Returned to chair with call bell in reach. RN advised to leave on RA.  Hildred Alamin Mobility Specialist  Secure Chat Only

## 2022-02-23 DIAGNOSIS — I4891 Unspecified atrial fibrillation: Secondary | ICD-10-CM | POA: Diagnosis not present

## 2022-02-23 DIAGNOSIS — I5041 Acute combined systolic (congestive) and diastolic (congestive) heart failure: Secondary | ICD-10-CM | POA: Diagnosis not present

## 2022-02-23 DIAGNOSIS — I214 Non-ST elevation (NSTEMI) myocardial infarction: Secondary | ICD-10-CM | POA: Diagnosis not present

## 2022-02-23 LAB — BASIC METABOLIC PANEL
Anion gap: 10 (ref 5–15)
BUN: 18 mg/dL (ref 8–23)
CO2: 22 mmol/L (ref 22–32)
Calcium: 8.9 mg/dL (ref 8.9–10.3)
Chloride: 104 mmol/L (ref 98–111)
Creatinine, Ser: 1.14 mg/dL — ABNORMAL HIGH (ref 0.44–1.00)
GFR, Estimated: 46 mL/min — ABNORMAL LOW (ref >60)
Glucose, Bld: 107 mg/dL — ABNORMAL HIGH (ref 70–99)
Potassium: 4.2 mmol/L (ref 3.5–5.1)
Sodium: 136 mmol/L (ref 135–145)

## 2022-02-23 LAB — CBC
HCT: 34.8 % — ABNORMAL LOW (ref 36.0–46.0)
Hemoglobin: 11.4 g/dL — ABNORMAL LOW (ref 12.0–15.0)
MCH: 29.1 pg (ref 26.0–34.0)
MCHC: 32.8 g/dL (ref 30.0–36.0)
MCV: 88.8 fL (ref 80.0–100.0)
Platelets: 369 10*3/uL (ref 150–400)
RBC: 3.92 MIL/uL (ref 3.87–5.11)
RDW: 12.6 % (ref 11.5–15.5)
WBC: 8 10*3/uL (ref 4.0–10.5)
nRBC: 0 % (ref 0.0–0.2)

## 2022-02-23 LAB — MAGNESIUM: Magnesium: 2.3 mg/dL (ref 1.7–2.4)

## 2022-02-23 MED ORDER — ORAL CARE MOUTH RINSE: 15.0000 mL | OROMUCOSAL | Status: DC | PRN

## 2022-02-23 MED ORDER — LOSARTAN POTASSIUM 25 MG PO TABS
25.0000 mg | ORAL_TABLET | Freq: Every day | ORAL | Status: DC
  Administered 2022-02-23 – 2022-02-24 (×2): 25 mg via ORAL
  Filled 2022-02-23 (×2): qty 1

## 2022-02-23 NOTE — Progress Notes (Signed)
Rounding Note    Patient Name: [name] Date of Encounter: 02/23/2022  [place] Cardiologist: None   Subjective   BP 138/66, Cr 1.14, Hgb 11.  Denies any chest pain or dyspnea  Inpatient Medications    Scheduled Meds:  amiodarone  200 mg Oral BID   Followed by   Derrill Memo ON 03/08/2022] amiodarone  200 mg Oral Daily   apixaban  5 mg Oral BID   aspirin EC  81 mg Oral Daily   atorvastatin  80 mg Oral Daily   empagliflozin  10 mg Oral Daily   metoprolol succinate  50 mg Oral Daily   sodium chloride flush  3 mL Intravenous Q12H   spironolactone  12.5 mg Oral Daily   Continuous Infusions:  sodium chloride     PRN Meds: sodium chloride, acetaminophen, nitroGLYCERIN, ondansetron (ZOFRAN) IV, mouth rinse, sodium chloride flush   Vital Signs    Vitals:   02/22/22 2310 02/23/22 0322 02/23/22 0419 02/23/22 0731  BP: 129/64 128/65  138/66  Pulse: 63 65  66  Resp: (!) '22 15  18  '$ Temp: (!) 97.5 F (36.4 C) 98 F (36.7 C)  98.6 F (37 C)  TempSrc: Oral Oral  Oral  SpO2: 94% 95%  95%  Weight:   79.2 kg   Height:        Intake/Output Summary (Last 24 hours) at 02/23/2022 0841 Last data filed at 02/23/2022 0800 Gross per 24 hour  Intake 965 ml  Output 300 ml  Net 665 ml       02/23/2022    4:19 AM 02/21/2022    6:15 AM 02/20/2022    3:45 AM  Last 3 Weights  Weight (lbs) 174 lb 9.7 oz 174 lb 12.8 oz 175 lb 1.6 oz  Weight (kg) 79.2 kg 79.289 kg 79.425 kg      Telemetry    Normal sinus rhythm with PACs- Personally Reviewed  ECG    No new EKG- Personally Reviewed  Physical Exam   GEN: No acute distress.   Neck: No JVD Cardiac: Irregular, normal rate, no murmurs, rubs, or gallops.  Respiratory: Clear to auscultation bilaterally. GI: Soft, nontender, non-distended  MS: No edema; No deformity. Neuro:  Nonfocal  Psych: Normal affect   Labs    High Sensitivity Troponin:   Recent Labs  Lab 02/19/22 1212 02/19/22 1430  TROPONINIHS 2,400*  2,268*      Chemistry Recent Labs  Lab 02/20/22 0027 02/21/22 0506 02/21/22 1429 02/22/22 0035 02/23/22 0025  NA 132* 134* 131* 133* 136  K 4.2 4.5 3.9 4.2 4.2  CL 100 100 99 99 104  CO2 '22 24 23 23 22  '$ GLUCOSE 134* 121* 116* 112* 107*  BUN 27* '22 19 19 18  '$ CREATININE 1.45* 1.29* 1.21* 1.22* 1.14*  CALCIUM 8.9 9.0 8.7* 8.8* 8.9  MG  --  1.8  --  2.2 2.3  PROT 5.9*  --   --   --   --   ALBUMIN 2.9*  --   --   --   --   AST 30  --   --   --   --   ALT 17  --   --   --   --   ALKPHOS 60  --   --   --   --   BILITOT 0.9  --   --   --   --   GFRNONAA 35* 40* 43* 43* 46*  ANIONGAP 10  $'10 9 11 10     'A$ Lipids  Recent Labs  Lab 02/20/22 0027  CHOL 142  TRIG 58  HDL 35*  LDLCALC 95  CHOLHDL 4.1     Hematology Recent Labs  Lab 02/21/22 0506 02/22/22 0035 02/23/22 0025  WBC 10.3 9.8 8.0  RBC 4.15 3.74* 3.92  HGB 12.3 11.0* 11.4*  HCT 35.6* 32.7* 34.8*  MCV 85.8 87.4 88.8  MCH 29.6 29.4 29.1  MCHC 34.6 33.6 32.8  RDW 12.5 12.6 12.6  PLT 339 311 369    Thyroid  Recent Labs  Lab 02/21/22 0506  TSH 5.670*     BNP Recent Labs  Lab 02/19/22 1212  BNP 1,168.8*     DDimer No results for input(s): "DDIMER" in the last 168 hours.   Radiology    DG CHEST PORT 1 VIEW  Result Date: 02/21/2022 CLINICAL DATA:  Pulmonary edema EXAM: PORTABLE CHEST 1 VIEW COMPARISON:  02/19/2022 FINDINGS: Single frontal view of the chest demonstrates a stable cardiac silhouette. Persistent bilateral hilar prominence which could reflect adenopathy or underlying pulmonary vascular congestion. Continued diffuse interstitial prominence, with developing bibasilar ground-glass airspace disease right greater than left. No effusion or pneumothorax. No acute bony abnormality. IMPRESSION: 1. Developing bibasilar airspace disease which could reflect edema, aspiration, or infection. 2. Persistent bilateral hilar prominence, which may be related to underlying adenopathy or vascular shadow. 3. The  right upper lobe nodule seen on prior x-ray is not as well seen on this radiograph. If further evaluation is indicated, CT chest could be performed. Electronically Signed   By: Randa Ngo M.D.   On: 02/21/2022 13:16    Cardiac Studies     Patient Profile     88 y.o. female with history of hypertension presenting with DOE and fatigue, found to be in A-fib with RVR with elevated troponin and BNP with finding of 100% stenosis of proximal to distal LAD on LHC.  Assessment & Plan    Atrial fibrillation: Was in sinus rhythm on presentation, developed A-fib/flutter during hospitalization.   -Continue metoprolol 25 mg twice daily -Continue Eliquis 5 mg twice daily -Converted to sinus rhythm on amiodarone drip.  Transitioned to p.o. amiodarone, plan 200 mg twice daily x2 weeks then 200 mg daily  CAD: Presented with elevated troponin (2400 > 2268).  LHC 02/20/2022 showed occluded proximal to mid LAD, otherwise mild nonobstructive disease, LVEDP 13.  Suspected that patient's MI occurred over prior 2 weeks, medical management recommended.  She denies any anginal symptoms -Continue aspirin, statin -Continue metoprolol 25 mg twice daily  Acute combined systolic and diastolic heart failure: Echo 02/19/2022 shows EF 25 to 30%, normal RV function, no significant valvular disease.  BNP 1168 on admission.  Normal LVEDP on cath 11/2 -Currently appears euvolemic. -Continue metoprolol, will consolidate to Toprol-XL  -Added Jardiance 10 mg daily and spironolactone 12.5 mg daily 11/4.  Will add losartan 25 mg daily  AKI: Creatinine 1.46 on presentation.  Improved with diuresis, 1.14 today  Disposition: PT/OT consulted  -Diet heart healthy -DVT PPx: Eliquis -Code: DNR, confirmed with patient  For questions or updates, please contact [place] Please consult [email or web address] for contact info under        Signed, Donato Heinz, MD  02/23/2022, 8:41 AM

## 2022-02-23 NOTE — Evaluation (Signed)
Occupational Therapy Evaluation Patient Details Name: [name] MRN: [id number] DOB: 07/11/1931 Today's Date: 02/23/2022   History of Present Illness Pt is a 88 y.o. F who presents 02/19/2022 with DOE and fatigue, found to be in A-fib with RVR with elevated troponin and BNP with finding of 100% stenosis of proximal to distal LAD on LHC. Significant ION:[id number] TKA.   Clinical Impression   Pt reports independence at baseline with ADLs and functional mobility, lives alone but has family next door that can assist PRN. Pt needing min guard-min A for ADLs, min guard for bed mobility, and min guard-min A for transfers without AD. Pt reaching for external support with short distance mobility. Pt reporting impaired fine motor skills due to peripheral neuropathy, however BUE WFL for basic tasks. Pt presenting with impairments listed below, will follow acutely. Recommend HHOT at d/c.      Recommendations for follow up therapy are one component of a multi-disciplinary discharge planning process, led by the attending physician.  Recommendations may be updated based on patient status, additional functional criteria and insurance authorization.   Follow Up Recommendations  Home health OT    Assistance Recommended at Discharge Set up Supervision/Assistance  Patient can return home with the following A little help with walking and/or transfers;A little help with bathing/dressing/bathroom;Assistance with cooking/housework;Help with stairs or ramp for entrance;Assist for transportation    Functional Status Assessment  Patient has had a recent decline in their functional status and demonstrates the ability to make significant improvements in function in a reasonable and predictable amount of time.  Equipment Recommendations  None recommended by OT (pt has all needed DME)    Recommendations for Other Services PT consult     Precautions / Restrictions Precautions Precautions:  Fall Restrictions Weight Bearing Restrictions: No      Mobility Bed Mobility Overal bed mobility: Needs Assistance Bed Mobility: Supine to Sit     Supine to sit: Min guard     General bed mobility comments: use of bedrail, cues for precautions from heart cath    Transfers Overall transfer level: Needs assistance Equipment used: Rolling walker (2 wheels) Transfers: Sit to/from Stand Sit to Stand: Min guard, Min assist                  Balance Overall balance assessment: Needs assistance Sitting-balance support: Feet supported Sitting balance-Leahy Scale: Good Sitting balance - Comments: able to reach outside BOS without LOB   Standing balance support: During functional activity, Reliant on assistive device for balance Standing balance-Leahy Scale: Fair Standing balance comment: can stand statically and walk short distance without AD, reaches for bedrails/countertops with mobility                           ADL either performed or assessed with clinical judgement   ADL Overall ADL's : Needs assistance/impaired Eating/Feeding: Modified independent   Grooming: Min guard;Standing   Upper Body Bathing: Minimal assistance   Lower Body Bathing: Minimal assistance   Upper Body Dressing : Minimal assistance   Lower Body Dressing: Minimal assistance   Toilet Transfer: Min guard;Ambulation;Regular Toilet   Toileting- Water quality scientist and Hygiene: Modified independent       Functional mobility during ADLs: Min guard       Vision   Vision Assessment?: No apparent visual deficits     Perception     Praxis      Pertinent Vitals/Pain Pain Assessment Pain Assessment: No/denies pain  Hand Dominance     Extremity/Trunk Assessment Upper Extremity Assessment Upper Extremity Assessment: Overall WFL for tasks assessed (impaired fine motor coordination, hx of peripheral neuropathy RUE  > LUE)   Lower Extremity Assessment Lower Extremity  Assessment: Defer to PT evaluation   Cervical / Trunk Assessment Cervical / Trunk Assessment: Normal   Communication Communication Communication: No difficulties   Cognition Arousal/Alertness: Awake/alert Behavior During Therapy: WFL for tasks assessed/performed Overall Cognitive Status: Within Functional Limits for tasks assessed                                       General Comments  VSS on RA    Exercises     Shoulder Instructions      Home Living Family/patient expects to be discharged to:: Private residence Living Arrangements: Alone Available Help at Discharge: Family;Available PRN/intermittently Type of Home: House Home Access: Stairs to enter [employer] of Steps: 2   Home Layout: One level     Bathroom Shower/Tub: Walk-in shower         Home Equipment: Conservation officer, nature (2 wheels);Cane - single point;Shower seat   Additional Comments: has access to w/c per son      Prior Functioning/Environment Prior Level of Function : Independent/Modified Independent             Mobility Comments: reports rarely using RW/cane ADLs Comments: ind with ADLs, reports will not drive after this hospital stay        OT Problem List: Decreased strength;Decreased range of motion;Decreased activity tolerance;Impaired balance (sitting and/or standing);Decreased safety awareness      OT Treatment/Interventions: Self-care/ADL training;Therapeutic exercise;DME and/or AE instruction;Energy conservation;Therapeutic activities;Patient/family education;Balance training    OT Goals(Current goals can be found in the care plan section) Acute Rehab OT Goals Patient Stated Goal: none stated OT Goal Formulation: With patient Time For Goal Achievement: 03/09/22 Potential to Achieve Goals: Good ADL Goals Pt Will Perform Lower Body Dressing: sit to/from stand;sitting/lateral leans;with modified independence Pt Will Transfer to Toilet: ambulating;regular  height toilet;with modified independence Pt Will Perform Tub/Shower Transfer: Shower transfer;ambulating;shower seat;with modified independence  OT Frequency: Min 2X/week    Co-evaluation              AM-PAC OT "6 Clicks" Daily Activity     Outcome Measure Help from another person eating meals?: None Help from another person taking care of personal grooming?: A Little Help from another person toileting, which includes using toliet, bedpan, or urinal?: A Little Help from another person bathing (including washing, rinsing, drying)?: A Little Help from another person to put on and taking off regular upper body clothing?: A Little Help from another person to put on and taking off regular lower body clothing?: A Little 6 Click Score: 19   End of Session Equipment Utilized During Treatment: Gait belt Nurse Communication: Mobility status  Activity Tolerance: Patient tolerated treatment well Patient left: in bed;with call bell/phone within reach;with family/visitor present (seated EOB)  OT Visit Diagnosis: Unsteadiness on feet (R26.81);Other abnormalities of gait and mobility (R26.89);Muscle weakness (generalized) (M62.81)                Time: 1610-9604 OT Time Calculation (min): 17 min Charges:  OT General Charges $OT Visit: 1 Visit OT Evaluation $OT Eval Moderate Complexity: 1 [address] OTD, OTR/L Acute Rehab [phone]) [address] - [place] 02/23/2022, 9:46 AM

## 2022-02-23 NOTE — Plan of Care (Signed)

## 2022-02-23 NOTE — Evaluation (Signed)
Physical Therapy Evaluation Patient Details Name: [name] MRN: [id number] DOB: Apr 22, 1931 Today's Date: 02/23/2022  History of Present Illness  Pt is a 87 y.o. F who presents 02/19/2022 with DOE and fatigue, found to be in A-fib with RVR with elevated troponin and BNP with finding of 100% stenosis of proximal to distal LAD on LHC. Significant FAO:[id number] TKA.  Clinical Impression  PTA, pt lives alone and is independent with mobility and ADL's. Pt has family next door that can assist as needed. Pt presents with decreased functional mobility secondary to static/dynamic balance impairments and decreased endurance. Pt ambulating 400 ft with a Rollator at a min guard assist level. SpO2 94%, HR 67-96 bpm. Pt with preference for RW for household ambulation due to size. Will benefit from follow up HHPT to address deficits and maximize functional mobility.      Recommendations for follow up therapy are one component of a multi-disciplinary discharge planning process, led by the attending physician.  Recommendations may be updated based on patient status, additional functional criteria and insurance authorization.  Follow Up Recommendations Home health PT      Assistance Recommended at Discharge PRN  Patient can return home with the following  A little help with walking and/or transfers;A little help with bathing/dressing/bathroom;Assistance with cooking/housework;Assist for transportation;Help with stairs or ramp for entrance    Equipment Recommendations None recommended by PT (pt has needed DME)  Recommendations for Other Services       Functional Status Assessment Patient has had a recent decline in their functional status and demonstrates the ability to make significant improvements in function in a reasonable and predictable amount of time.     Precautions / Restrictions Precautions Precautions: Fall Restrictions Weight Bearing Restrictions: No      Mobility  Bed  Mobility Overal bed mobility: Modified Independent Bed Mobility: Sit to Supine                Transfers Overall transfer level: Needs assistance Equipment used: Rolling walker (2 wheels) Transfers: Sit to/from Stand Sit to Stand: Min guard                Ambulation/Gait Ambulation/Gait assistance: Min guard Gait Distance (Feet): 400 Feet Assistive device: Rollator (4 wheels) Gait Pattern/deviations: Step-through pattern, Decreased stride length, Trunk flexed Gait velocity: decreased     General Gait Details: Min guard for safety, no overt LOB appreciated  Stairs            Wheelchair Mobility    Modified Rankin (Stroke Patients Only)       Balance Overall balance assessment: Needs assistance Sitting-balance support: Feet supported Sitting balance-Leahy Scale: Good Sitting balance - Comments: able to reach outside BOS without LOB   Standing balance support: During functional activity, Reliant on assistive device for balance Standing balance-Leahy Scale: Fair Standing balance comment: can stand statically and march in place without AD, dynamically requires external support                             Pertinent Vitals/Pain Pain Assessment Pain Assessment: Faces Faces Pain Scale: Hurts a little bit Pain Location: R hip (chronic) Pain Descriptors / Indicators: Discomfort Pain Intervention(s): Monitored during session    Home Living Family/patient expects to be discharged to:: Private residence Living Arrangements: Alone Available Help at Discharge: Family;Available PRN/intermittently Type of Home: House Home Access: Stairs to enter   Entrance Stairs-Number of Steps: 2   Home Layout: One  level Home Equipment: Conservation officer, nature (2 wheels);Cane - single point;Shower seat Additional Comments: has access to w/c per son    Prior Function Prior Level of Function : Independent/Modified Independent             Mobility Comments:  reports rarely using RW/cane ADLs Comments: ind with ADLs, reports will not drive after this hospital stay     Hand Dominance        Extremity/Trunk Assessment   Upper Extremity Assessment Upper Extremity Assessment: Defer to OT evaluation    Lower Extremity Assessment Lower Extremity Assessment: RLE deficits/detail;LLE deficits/detail RLE Deficits / Details: Strength 5/5, hx TKA RLE Sensation: history of peripheral neuropathy LLE Deficits / Details: Strength 5/5, hx TKA LLE Sensation: history of peripheral neuropathy    Cervical / Trunk Assessment Cervical / Trunk Assessment: Normal  Communication   Communication: No difficulties  Cognition Arousal/Alertness: Awake/alert Behavior During Therapy: WFL for tasks assessed/performed Overall Cognitive Status: Within Functional Limits for tasks assessed                                          General Comments General comments (skin integrity, edema, etc.): VSS on RA    Exercises     Assessment/Plan    PT Assessment Patient needs continued PT services  PT Problem List Decreased activity tolerance;Decreased balance;Decreased mobility       PT Treatment Interventions DME instruction;Gait training;Stair training;Functional mobility training;Therapeutic activities;Therapeutic exercise;Balance training;Patient/family education    PT Goals (Current goals can be found in the Care Plan section)  Acute Rehab PT Goals Patient Stated Goal: agreeable to HHPT PT Goal Formulation: With patient/family Time For Goal Achievement: 03/09/22 Potential to Achieve Goals: Good    Frequency Min 3X/week     Co-evaluation               AM-PAC PT "6 Clicks" Mobility  Outcome Measure Help needed turning from your back to your side while in a flat bed without using bedrails?: None Help needed moving from lying on your back to sitting on the side of a flat bed without using bedrails?: None Help needed moving to and  from a bed to a chair (including a wheelchair)?: A Little Help needed standing up from a chair using your arms (e.g., wheelchair or bedside chair)?: A Little Help needed to walk in hospital room?: A Little Help needed climbing 3-5 steps with a railing? : A Little 6 Click Score: 20    End of Session Equipment Utilized During Treatment: Gait belt Activity Tolerance: Patient tolerated treatment well Patient left: in bed;with call bell/phone within reach;with bed alarm set Nurse Communication: Mobility status PT Visit Diagnosis: Unsteadiness on feet (R26.81)    Time: 8144-8185 PT Time Calculation (min) (ACUTE ONLY): 20 min   Charges:   PT Evaluation $PT Eval Low Complexity: 1 Low          Wyona Almas, PT, DPT Acute Rehabilitation Services Office [phone]   Deno Etienne 02/23/2022, 10:29 AM

## 2022-02-24 ENCOUNTER — Other Ambulatory Visit (HOSPITAL_COMMUNITY): Payer: Self-pay

## 2022-02-24 DIAGNOSIS — I4891 Unspecified atrial fibrillation: Secondary | ICD-10-CM | POA: Diagnosis not present

## 2022-02-24 LAB — CBC
HCT: 34.2 % — ABNORMAL LOW (ref 36.0–46.0)
Hemoglobin: 11.6 g/dL — ABNORMAL LOW (ref 12.0–15.0)
MCH: 29.5 pg (ref 26.0–34.0)
MCHC: 33.9 g/dL (ref 30.0–36.0)
MCV: 87 fL (ref 80.0–100.0)
Platelets: 371 10*3/uL (ref 150–400)
RBC: 3.93 MIL/uL (ref 3.87–5.11)
RDW: 12.6 % (ref 11.5–15.5)
WBC: 7.1 10*3/uL (ref 4.0–10.5)
nRBC: 0 % (ref 0.0–0.2)

## 2022-02-24 LAB — BASIC METABOLIC PANEL
Anion gap: 8 (ref 5–15)
BUN: 19 mg/dL (ref 8–23)
CO2: 22 mmol/L (ref 22–32)
Calcium: 8.8 mg/dL — ABNORMAL LOW (ref 8.9–10.3)
Chloride: 105 mmol/L (ref 98–111)
Creatinine, Ser: 1.14 mg/dL — ABNORMAL HIGH (ref 0.44–1.00)
GFR, Estimated: 46 mL/min — ABNORMAL LOW (ref >60)
Glucose, Bld: 108 mg/dL — ABNORMAL HIGH (ref 70–99)
Potassium: 4.4 mmol/L (ref 3.5–5.1)
Sodium: 135 mmol/L (ref 135–145)

## 2022-02-24 MED ORDER — NITROGLYCERIN 0.4 MG SL SUBL
0.4000 mg | SUBLINGUAL_TABLET | SUBLINGUAL | 1 refills | Status: AC | PRN
  Filled 2022-02-24: qty 25, 7d supply, fill #0

## 2022-02-24 MED ORDER — LOSARTAN POTASSIUM 25 MG PO TABS
25.0000 mg | ORAL_TABLET | Freq: Every day | ORAL | 0 refills | Status: DC
  Filled 2022-02-24: qty 90, 90d supply, fill #0

## 2022-02-24 MED ORDER — ATORVASTATIN CALCIUM 80 MG PO TABS
80.0000 mg | ORAL_TABLET | Freq: Every day | ORAL | 0 refills | Status: DC
  Filled 2022-02-24: qty 90, 90d supply, fill #0

## 2022-02-24 MED ORDER — ASPIRIN 81 MG PO TBEC
81.0000 mg | DELAYED_RELEASE_TABLET | Freq: Every day | ORAL | 12 refills | Status: DC
  Filled 2022-02-24: qty 30, 30d supply, fill #0

## 2022-02-24 MED ORDER — EMPAGLIFLOZIN 10 MG PO TABS
10.0000 mg | ORAL_TABLET | Freq: Every day | ORAL | 0 refills | Status: AC
  Filled 2022-02-24: qty 30, 30d supply, fill #0

## 2022-02-24 MED ORDER — METOPROLOL SUCCINATE ER 50 MG PO TB24
50.0000 mg | ORAL_TABLET | Freq: Every day | ORAL | 0 refills | Status: DC
  Filled 2022-02-24: qty 90, 90d supply, fill #0

## 2022-02-24 MED ORDER — APIXABAN 5 MG PO TABS
5.0000 mg | ORAL_TABLET | Freq: Two times a day (BID) | ORAL | 0 refills | Status: DC
  Filled 2022-02-24: qty 60, 30d supply, fill #0

## 2022-02-24 MED ORDER — AMIODARONE HCL 200 MG PO TABS
ORAL_TABLET | ORAL | 0 refills | Status: DC
  Filled 2022-02-24: qty 86, 73d supply, fill #0

## 2022-02-24 MED ORDER — SPIRONOLACTONE 25 MG PO TABS
12.5000 mg | ORAL_TABLET | Freq: Every day | ORAL | 0 refills | Status: DC
  Filled 2022-02-24: qty 45, 90d supply, fill #0

## 2022-02-24 NOTE — Discharge Summary (Addendum)
Discharge Summary    Patient ID: [name] MRN: [id number]; DOB: Sep 18, 1931  Admit date: 02/19/2022 Discharge date: 02/24/2022  PCP:  Donnajean Lopes, MD   [place] Providers Cardiologist:  None        Discharge Diagnoses    Principal Problem:   NSTEMI (non-ST elevated myocardial infarction) [facility]) Active Problems:   Atrial fibrillation with RVR (HCC)   Elevated troponin   Ischemic cardiomyopathy   Morbid obesity ([place])   Acute combined systolic and diastolic heart failure ([place])  Diagnostic Studies/Procedures   LEFT HEART CATH AND CORONARY ANGIOGRAPHY on 02/20/2022    Mid LM to Dist LM lesion is 20% stenosed.   Prox LAD to Mid LAD lesion is 100% stenosed.   Mid LAD to Dist LAD lesion is 100% stenosed.   Ost Cx to Prox Cx lesion is 20% stenosed.   1st Mrg lesion is 30% stenosed.   Mid Cx lesion is 20% stenosed.   Dist Cx lesion is 20% stenosed.   Probable recent total occlusion of the proximal LAD after a small proximal septal perforating artery.  There is faint collateralization via inferior septal perforating arteries to LAD proximal to mid septals as well as from the circumflex marginal to distal LAD.   Mild nonobstructive disease involving the distal left main, and left circumflex coronary artery.   Normal dominant RCA.   LVEDP 13 mmHg.   Coronary Diagrams  Diagnostic Dominance: Right  I     ECHOCARDIOGRAM REPORT  on 02/20/2022 IMPRESSION:   1. Left ventricular ejection fraction, by estimation, is 25 to 30%. The  left ventricle has severely decreased function. The left ventricle  demonstrates regional wall motion abnormalities with mid to apical  anteroseptal and anterior, apical inferior,  apical lateral, and true apex akinesis. No LV thrombus noted. Findings  consistent with LAD territory infarction. Left ventricular diastolic  parameters are indeterminate.   2. Right ventricular systolic function is normal. The right  ventricular  size is normal. Tricuspid regurgitation signal is inadequate for assessing  PA pressure.   3. Left atrial size was mildly dilated.   4. The mitral valve is normal in structure. Trivial mitral valve  regurgitation. No evidence of mitral stenosis.   5. The aortic valve is normal in structure. Aortic valve regurgitation is  not visualized. No aortic stenosis is present.   6. The inferior vena cava is normal in size with <50% respiratory  variability, suggesting right atrial pressure of 8 mmHg.   7. The patient was in atrial fibrillation.   FINDINGS   Left Ventricle: Left ventricular ejection fraction, by estimation, is 25  to 30%. The left ventricle has severely decreased function. The left  ventricle demonstrates regional wall motion abnormalities. Definity  contrast agent was given IV to delineate  the left ventricular endocardial borders. The left ventricular internal  cavity size was normal in size. There is no left ventricular hypertrophy.  Left ventricular diastolic parameters are indeterminate.   Right Ventricle: The right ventricular size is normal. No increase in  right ventricular wall thickness. Right ventricular systolic function is  normal. Tricuspid regurgitation signal is inadequate for assessing PA  pressure.   Left Atrium: Left atrial size was mildly dilated.   Right Atrium: Right atrial size was normal in size.   Pericardium: There is no evidence of pericardial effusion.   Mitral Valve: The mitral valve is normal in structure. Mild mitral annular  calcification. Trivial mitral valve regurgitation. No evidence of mitral  valve stenosis.   Tricuspid Valve: The tricuspid valve is normal in structure. Tricuspid  valve regurgitation is not demonstrated.   Aortic Valve: The aortic valve is normal in structure. Aortic valve  regurgitation is not visualized. No aortic stenosis is present.   Pulmonic Valve: The pulmonic valve was normal in structure.  Pulmonic valve  regurgitation is not visualized.   Aorta: The aortic root is normal in size and structure.   Venous: The inferior vena cava is normal in size with less than 50%  respiratory variability, suggesting right atrial pressure of 8 mmHg.   IAS/Shunts: No atrial level shunt detected by color flow Doppler.     LEFT VENTRICLE  PLAX 2D  LVOT diam:     2.10 cm  LV SV:         60  LV SV Index:   33  LVOT Area:     3.46 cm     RIGHT VENTRICLE  RV S prime:     9.57 cm/s  TAPSE (M-mode): 1.6 cm   LEFT ATRIUM             Index        RIGHT ATRIUM           Index  LA Vol (A2C):   52.4 ml 28.48 ml/m  RA Area:     11.30 cm  LA Vol (A4C):   44.8 ml 24.35 ml/m  RA Volume:   23.70 ml  12.88 ml/m  LA Biplane Vol: 52.7 ml 28.65 ml/m   AORTIC VALVE  LVOT Vmax:   108.00 cm/s  LVOT Vmean:  75.800 cm/s  LVOT VTI:    0.173 m    AORTA  Ao Root diam: 2.40 cm     SHUNTS  Systemic VTI:  0.17 m  Systemic Diam: 2.10 cm   Dalton McleanMD  Electronically signed by Franki Monte  Signature Date/Time: 02/19/2022/4:36:36 PM    EXAM: CHEST - 2 VIEW COMPARISON:  Radiographs 03/12/2010 and earlier.   FINDINGS: Heart size within normal limits. Aortic atherosclerosis. Prominence of the hila bilaterally, increased from the prior examination of 03/12/2010. 4 mm nodular opacity projecting in the region of the right upper lobe on the PA radiograph. Small ill-defined opacities within the bilateral lung bases, likely reflecting atelectasis. No evidence of pleural effusion or pneumothorax. Degenerative changes of the spine.   IMPRESSION: Prominence of the hila bilaterally, increased from the prior examination of 03/12/2010. Although nonspecific, this finding can reflect hilar lymphadenopathy and a contrast-enhanced chest CT is recommended for further evaluation.   4 mm nodular opacity projecting in the region of the right upper lobe on the PA radiograph. This may reflect a small  pulmonary nodule. Attention recommended at time of CT follow-up.   Prominence of the interstitial lung markings. Findings may reflect chronic interstitial changes and/or interstitial edema.   Mild bibasilar atelectasis.   History of Present Illness     [name] is a 88 y.o. female with PMH of  HTN presented to ED with dyspnea on exertion, lightheadedness, fatigue x 1 week. Patient reports she has felt unusually fatigued with shortness of breath for past few day. SHOB worse with physical activity and later even at rest. Denies symptoms of chest pain, orthopnea, palpitations or syncope. She denied nausea, vomiting, diarrhea. Patient states twice over the weekend she experienced hot flash, with sweating. These episodes were self-limited/short lasting.   Upon arrival to ED on 02/19/22 pt was found to have NSTEMI with elevated  troponin, 2402 -> 2268. Her hospital course was complicated by Afib with RVR with no prior h/o A. Fib.  Pt were started on diltiazem drip for rate control, then amiodarone and digoxin. Diltiazem and digoxin was discontinued and pt were converted to NSR on amiodarone drip.   Overall, patient presented with atrial fibrillation with rapid ventricular response, symptoms consistent with acute on chronic congestive heart failure related to rhythm and underlying LV dysfunction due to coronary disease. BMP notable to be elevated 1168. Echo demonstrated LVEF 25-30% with regional LV wall motion abnormalities. Cardiac Cath: Total occlusion of proximal LAD.  Pt were admitted to cardiac unit for further management.   On evaluation today patient comfortably siting in bed, communicating in full sentences. Son present at the bedside along with exercise physiologist who just completed cardiac rehab. Pt is doing fairy well. She remains in sinus rhythm with HR around between 60-70's. Her SOB have improved. She ambulated in the hallway without anginal symptoms. Currently, she denies CP, or SOB.  Pt states she is ready for discharge home. Pt have good family support, son lives in the same community and daughter came from out of town today planned to stay with the patient. Plan on medical management of CHF and CAD given patient preference for no aggressive interventions.    Physical Exam Vitals and nursing note reviewed.  Constitutional:      Appearance: She is well-developed.  HENT:     Head: Normocephalic.  Cardiovascular:     Rate and Rhythm: Normal rate and regular rhythm.     Comments: No LE edema.  Pulmonary:     Effort: Pulmonary effort is normal. No tachypnea.     Breath sounds: Normal breath sounds.     Comments: Lungs CTA. No W/R/R appreciated to auscultation  Abdominal:     Palpations: Abdomen is soft.  Musculoskeletal:     Cervical back: Neck supple.     Right lower leg: No edema.     Left lower leg: No edema.  Skin:    General: Skin is warm.     Capillary Refill: Capillary refill takes less than 2 seconds.  Neurological:     Mental Status: She is alert.  Psychiatric:        Mood and Affect: Mood normal.        Behavior: Behavior normal.       Hospital Course      1) Atrial fibrillation with RVR:   Pt developed A Fib/flutter during the hospitalization. Converted to sinus rhythm on amiodarone drip. She is transitioned to oral amiodarone. She will take 200 mg twice daily x2 weeks then 200 mg daily. Pt remains in sinus rhythm with HR between 60-70's. BP is well controlled. She denies chest pain, SOB , palpitations, dizziness or lightheadedness.  CHA2DS2- VASc score 4, Eliquis for stroke prophylaxis. Plan    -Amiodarone 200 mg twice daily x2 weeks then 200 mg daily -Toprol XL 50 mg  -Continue Eliquis 5 mg twice daily   2) Acute combined systolic and diastolic heart failure:    # Ischemic Cardiomyopathy  Pt presented with worsening DOE for 1 week, associated with dizziness/light headedness and diaphoresis.She denies PND or orthopnea. Bibasilar crackles  were noted to auscultation. BNP 1168 on admission. She received 40 mg IV lasix x 1. Lungs CTA no pedal edema or signs of fluid overload. She is currently euvolemic.  Echo 02/19/2022 shows EF 25 to 30%, normal RV function, no significant valvular disease.  Normal LVEDP on  cath 11/2.  Chronic combined systolic and diastolic heart failure due to ischemic cardiomyopathy. GDMT initiated. -Continue Metoprolol-XL 50 mg once daily  -Continue Losartan 25 mg daily (Pharmacy recommendations to change Losartan 25 once daily to Entresto 24/26 mg BID. To be discuss with patient on the follow up visit to the clinic.)  -Continue spironolactone 12.5 mg daily .   - Continue Jardiance 10 mg daily     3) CAD #NSTEMI (non-ST elevated myocardial infarction) ([place]) # Elevated Troponin # Hyperlipidemia Pt presented to ED with DOE with occasional diaphoresis and lightheadedness. She did not report chest pain. Labs reflected elevated troponin (2400 > 2268).   Left heart cardiac cath done on 02/20/2022 showed total occlusion of proximal to mid LAD, otherwise mild nonobstructive disease. Pt ambulated in the hallway with exercise Physiologist (cardiac rehab). Pt was returned to bed w/o anginal symptoms. -Continue aspirin -Atorvastatin 80 mg once daily -Continue Toprol XL 50 mg once daily    4)  AKI: Resolved with diuresis.  Creatinine 1.46 on admission, improved to 1.1 today    Did the patient have an acute coronary syndrome (MI, NSTEMI, STEMI, etc) this admission?:  Yes                               AHA/ACC Clinical Performance & Quality Measures: Aspirin prescribed? - Yes ADP Receptor Inhibitor (Plavix/Clopidogrel, Brilinta/Ticagrelor or Effient/Prasugrel) prescribed (includes medically managed patients)? - Yes Beta Blocker prescribed? - Yes High Intensity Statin (Lipitor 40-76m or Crestor 20-449m prescribed? - Yes EF assessed during THIS hospitalization? - Yes For EF <40%, was ACEI/ARB prescribed? - Yes For EF  <40%, Aldosterone Antagonist (Spironolactone or Eplerenone) prescribed? - Yes Cardiac Rehab Phase II ordered (including medically managed patients)? - Yes       Discharge Vitals Blood pressure 117/72, pulse 61, temperature 97.8 F (36.6 C), temperature source Oral, resp. rate 16, height 5' 2.75" (1.594 m), weight 78.5 kg, SpO2 96 %.  Filed Weights   02/21/22 0615 02/23/22 0419 02/24/22 0435  Weight: 79.3 kg 79.2 kg 78.5 kg    Labs & Radiologic Studies    CBC Recent Labs    02/23/22 0025 02/24/22 0015  WBC 8.0 7.1  HGB 11.4* 11.6*  HCT 34.8* 34.2*  MCV 88.8 87.0  PLT 369 [id number] Basic Metabolic Panel Recent Labs    02/22/22 0035 02/23/22 0025 02/24/22 0015  NA 133* 136 135  K 4.2 4.2 4.4  CL 99 104 105  CO2 _0 GLUCOSE 112* 107* 108*  BUN _1 CREATININE 1.22* 1.14* 1.14*  CALCIUM 8.8* 8.9 8.8*  MG 2.2 2.3  --    Liver Function Tests No results for input(s): "AST", "ALT", "ALKPHOS", "BILITOT", "PROT", "ALBUMIN" in the last 72 hours. No results for input(s): "LIPASE", "AMYLASE" in the last 72 hours. High Sensitivity Troponin:   Recent Labs  Lab 02/19/22 1212 02/19/22 1430  TROPONINIHS 2,400* 2,268*    BNP Invalid input(s): "POCBNP" D-Dimer No results for input(s): "DDIMER" in the last 72 hours. Hemoglobin A1C No results for input(s): "HGBA1C" in the last 72 hours. Fasting Lipid Panel No results for input(s): "CHOL", "HDL", "LDLCALC", "TRIG", "CHOLHDL", "LDLDIRECT" in the last 72 hours. Thyroid Function Tests No results for input(s): "TSH", "T4TOTAL", "T3FREE", "THYROIDAB" in the last 72 hours.  Invalid input(s): "FREET3" _____________  DG CHEST PORT 1 VIEW  Result Date: 02/21/2022 CLINICAL DATA:  Pulmonary edema EXAM: PORTABLE CHEST  1 VIEW COMPARISON:  02/19/2022 FINDINGS: Single frontal view of the chest demonstrates a stable cardiac silhouette. Persistent bilateral hilar prominence which could reflect adenopathy or underlying pulmonary  vascular congestion. Continued diffuse interstitial prominence, with developing bibasilar ground-glass airspace disease right greater than left. No effusion or pneumothorax. No acute bony abnormality. IMPRESSION: 1. Developing bibasilar airspace disease which could reflect edema, aspiration, or infection. 2. Persistent bilateral hilar prominence, which may be related to underlying adenopathy or vascular shadow. 3. The right upper lobe nodule seen on prior x-ray is not as well seen on this radiograph. If further evaluation is indicated, CT chest could be performed. Electronically Signed   By: Randa Ngo M.D.   On: 02/21/2022 13:16   CARDIAC CATHETERIZATION  Result Date: 02/20/2022   Mid LM to Dist LM lesion is 20% stenosed.   Prox LAD to Mid LAD lesion is 100% stenosed.   Mid LAD to Dist LAD lesion is 100% stenosed.   Ost Cx to Prox Cx lesion is 20% stenosed.   1st Mrg lesion is 30% stenosed.   Mid Cx lesion is 20% stenosed.   Dist Cx lesion is 20% stenosed. Probable recent total occlusion of the proximal LAD after a small proximal septal perforating artery.  There is faint collateralization via inferior septal perforating arteries to LAD proximal to mid septals as well as from the circumflex marginal to distal LAD. Mild nonobstructive disease involving the distal left main, and left circumflex coronary artery. Normal dominant RCA. LVEDP 13 mmHg. RECOMMENDATION: I suspect the patient's myocardial infarction has occurred over the past 1-1/2 to 2 weeks with onset of her progressive shortness of breath symptomatology.  On echocardiography EF is 25 to 30% with LAD infarction wall motion abnormalities.  Recommend guideline directed medical therapy for CAD and HFrEF.  Aggressive lipid-lowering therapy.   ECHOCARDIOGRAM COMPLETE  Result Date: 02/19/2022    ECHOCARDIOGRAM REPORT   Patient Name:   [name] Date of Exam: 02/19/2022 Medical Rec #:  [id number]       Height:       62.8 in Accession #:     [id number]      Weight:       179.0 lb Date of Birth:  [birth year]       BSA:          1.840 m Patient Age:    54 years        BP:           109/76 mmHg Patient Gender: F               HR:           106 bpm. Exam Location:  Inpatient Procedure: 2D Echo, Color Doppler, Cardiac Doppler and Intracardiac            Opacification Agent STAT ECHO Indications:    Elevated Troponins  History:        Patient has no prior history of Echocardiogram examinations.                 Risk Factors:Hypertension.  Sonographer:    Raquel Sarna Senior RDCS Referring Phys: [id number] [place]  1. Left ventricular ejection fraction, by estimation, is 25 to 30%. The left ventricle has severely decreased function. The left ventricle demonstrates regional wall motion abnormalities with mid to apical anteroseptal and anterior, apical inferior, apical lateral, and true apex akinesis. No LV thrombus noted. Findings consistent with LAD territory infarction. Left ventricular diastolic parameters are indeterminate.  2. Right ventricular systolic function is normal. The right ventricular size is normal. Tricuspid regurgitation signal is inadequate for assessing PA pressure.  3. Left atrial size was mildly dilated.  4. The mitral valve is normal in structure. Trivial mitral valve regurgitation. No evidence of mitral stenosis.  5. The aortic valve is normal in structure. Aortic valve regurgitation is not visualized. No aortic stenosis is present.  6. The inferior vena cava is normal in size with <50% respiratory variability, suggesting right atrial pressure of 8 mmHg.  7. The patient was in atrial fibrillation. FINDINGS  Left Ventricle: Left ventricular ejection fraction, by estimation, is 25 to 30%. The left ventricle has severely decreased function. The left ventricle demonstrates regional wall motion abnormalities. Definity contrast agent was given IV to delineate the left ventricular endocardial borders. The left ventricular internal cavity  size was normal in size. There is no left ventricular hypertrophy. Left ventricular diastolic parameters are indeterminate. Right Ventricle: The right ventricular size is normal. No increase in right ventricular wall thickness. Right ventricular systolic function is normal. Tricuspid regurgitation signal is inadequate for assessing PA pressure. Left Atrium: Left atrial size was mildly dilated. Right Atrium: Right atrial size was normal in size. Pericardium: There is no evidence of pericardial effusion. Mitral Valve: The mitral valve is normal in structure. Mild mitral annular calcification. Trivial mitral valve regurgitation. No evidence of mitral valve stenosis. Tricuspid Valve: The tricuspid valve is normal in structure. Tricuspid valve regurgitation is not demonstrated. Aortic Valve: The aortic valve is normal in structure. Aortic valve regurgitation is not visualized. No aortic stenosis is present. Pulmonic Valve: The pulmonic valve was normal in structure. Pulmonic valve regurgitation is not visualized. Aorta: The aortic root is normal in size and structure. Venous: The inferior vena cava is normal in size with less than 50% respiratory variability, suggesting right atrial pressure of 8 mmHg. IAS/Shunts: No atrial level shunt detected by color flow Doppler.  LEFT VENTRICLE PLAX 2D LVOT diam:     2.10 cm LV SV:         60 LV SV Index:   33 LVOT Area:     3.46 cm  RIGHT VENTRICLE RV S prime:     9.57 cm/s TAPSE (M-mode): 1.6 cm LEFT ATRIUM             Index        RIGHT ATRIUM           Index LA Vol (A2C):   52.4 ml 28.48 ml/m  RA Area:     11.30 cm LA Vol (A4C):   44.8 ml 24.35 ml/m  RA Volume:   23.70 ml  12.88 ml/m LA Biplane Vol: 52.7 ml 28.65 ml/m  AORTIC VALVE LVOT Vmax:   108.00 cm/s LVOT Vmean:  75.800 cm/s LVOT VTI:    0.173 m  AORTA Ao Root diam: 2.40 cm  SHUNTS Systemic VTI:  0.17 m Systemic Diam: 2.10 cm Dalton McleanMD Electronically signed by Franki Monte Signature Date/Time:  02/19/2022/4:36:36 PM    Final    DG Chest 2 View  Result Date: 02/19/2022 CLINICAL DATA:  Provided history: Chest pain. Additional history provided: Shortness of breath and chest pressure. History of hypertension. EXAM: CHEST - 2 VIEW COMPARISON:  Radiographs 03/12/2010 and earlier. FINDINGS: Heart size within normal limits. Aortic atherosclerosis. Prominence of the hila bilaterally, increased from the prior examination of 03/12/2010. 4 mm nodular opacity projecting in the region of the right upper lobe on the PA radiograph. Small ill-defined  opacities within the bilateral lung bases, likely reflecting atelectasis. No evidence of pleural effusion or pneumothorax. Degenerative changes of the spine. IMPRESSION: Prominence of the hila bilaterally, increased from the prior examination of 03/12/2010. Although nonspecific, this finding can reflect hilar lymphadenopathy and a contrast-enhanced chest CT is recommended for further evaluation. 4 mm nodular opacity projecting in the region of the right upper lobe on the PA radiograph. This may reflect a small pulmonary nodule. Attention recommended at time of CT follow-up. Prominence of the interstitial lung markings. Findings may reflect chronic interstitial changes and/or interstitial edema. Mild bibasilar atelectasis. Electronically Signed   By: Kellie Simmering D.O.   On: 02/19/2022 12:49   Disposition   Pt is being discharged home today in good condition.  Follow-up Plans & Appointments     Follow-up Information     [facility]. Go in 16 day(s).   Specialty: Cardiology Why: Hospital follow up PLEASE bring a current medication list to appointment FREE valet parking, Entrance C, off [employer] information: [address] [id number] [place] [phone]               Discharge Instructions     ([place]) Call MD:  Anytime you have any of the  following symptoms: 1) 3 pound weight gain in 24 hours or 5 pounds in 1 week 2) shortness of breath, with or without a dry hacking cough 3) swelling in the hands, feet or stomach 4) if you have to sleep on extra pillows at night in order to breathe.   Complete by: As directed    Amb Referral to Cardiac Rehabilitation   Complete by: As directed    Diagnosis: NSTEMI   After initial evaluation and assessments completed: Virtual Based Care may be provided alone or in conjunction with Phase 2 Cardiac Rehab based on patient barriers.: Yes   Intensive Cardiac Rehabilitation (ICR) [place] location only OR Traditional Cardiac Rehabilitation (TCR) *If criteria for ICR are not met will enroll in TCR [facility] only): Yes   Call MD for:  temperature >100.4   Complete by: As directed    Diet - low sodium heart healthy   Complete by: As directed    Discharge instructions   Complete by: As directed    Radial Site Care Refer to this sheet in the next few weeks. These instructions provide you with information on caring for yourself after your procedure. Your caregiver may also give you more specific instructions. Your treatment has been planned according to current medical practices, but problems sometimes occur. Call your caregiver if you have any problems or questions after your procedure. HOME CARE INSTRUCTIONS You may shower the day after the procedure. Remove the bandage (dressing) and gently wash the site with plain soap and water. Gently pat the site dry.  Do not apply powder or lotion to the site.  Do not submerge the affected site in water for 3 to 5 days.  Inspect the site at least twice daily.  Do not flex or bend the affected arm for 24 hours.  No lifting over 5 pounds (2.3 kg) for 5 days after your procedure.  Do not drive home if you are discharged the same day of the procedure. Have someone else drive you.  You may drive 24 hours after the procedure unless otherwise instructed by your caregiver.  What to  expect: Any bruising will usually fade within 1 to 2 weeks.  Blood  that collects in the tissue (hematoma) may be painful to the touch. It should usually decrease in size and tenderness within 1 to 2 weeks.  SEEK IMMEDIATE MEDICAL CARE IF: You have unusual pain at the radial site.  You have redness, warmth, swelling, or pain at the radial site.  You have drainage (other than a small amount of blood on the dressing).  You have chills.  You have a fever or persistent symptoms for more than 72 hours.  You have a fever and your symptoms suddenly get worse.  Your arm becomes pale, cool, tingly, or numb.  You have heavy bleeding from the site. Hold pressure on the site.   If you notice any bleeding such as blood in stool, black tarry stools, blood in urine, nosebleeds or any other unusual bleeding, call your doctor immediately. It is not normal to have this kind of bleeding while on a blood thinner and usually indicates there is an underlying problem with one of your body systems that needs to be checked out.   For patients with congestive heart failure, we give them these special instructions:  1. Follow a low-salt diet and watch your fluid intake. In general, you should not be taking in more than 2 liters of fluid per day (no more than 8 glasses per day). Some patients are restricted to less than 1.5 liters of fluid per day (no more than 6 glasses per day). This includes sources of water in foods like soup, coffee, tea, milk, etc. 2. Weigh yourself on the same scale at same time of day and keep a log. 3. Call your doctor: (Anytime you feel any of the following symptoms)  - 3-4 pound weight gain in 1-2 days or 2 pounds overnight  - Shortness of breath, with or without a dry hacking cough  - Swelling in the hands, feet or stomach  - If you have to sleep on extra pillows at night in order to breathe   IT IS IMPORTANT TO LET YOUR DOCTOR KNOW EARLY ON IF YOU ARE HAVING SYMPTOMS SO WE CAN HELP YOU!    Face-to-face encounter (required for Medicare/Medicaid patients)   Complete by: As directed    I Bhupinder Multani certify that this patient is under my care and that I, or a nurse practitioner or physician's assistant working with me, had a face-to-face encounter that meets the physician face-to-face encounter requirements with this patient on 02/24/2022. The encounter with the patient was in whole, or in part for the following medical condition(s) which is the primary reason for home health care (List medical condition): CAD, CHF, deconditioned, AFib   The encounter with the patient was in whole, or in part, for the following medical condition, which is the primary reason for home health care: CAD, CHF, deconditioned, AFib   I certify that, based on my findings, the following services are medically necessary home health services:  Nursing Physical therapy     Reason for Medically Necessary Home Health Services: Other See Comments   My clinical findings support the need for the above services: OTHER SEE COMMENTS   Further, I certify that my clinical findings support that this patient is homebound due to: Ambulates short distances less than 300 feet   Home Health   Complete by: As directed    To provide the following care/treatments:  PT OT RN Home Health Aide     Increase activity slowly   Complete by: As directed  Discharge Medications   Allergies as of 02/24/2022       Reactions   Bromfenac Other (See Comments)   Hives, welts   Lisinopril    Other Reaction(s): arthralgias   Meloxicam Other (See Comments)   unknown   Tramadol Hcl    Other Reaction(s): ineffective   Valacyclovir    GI Upset        Medication List     STOP taking these medications    amLODipine 5 MG tablet Commonly known as: NORVASC   olmesartan 40 MG tablet Commonly known as: BENICAR       TAKE these medications    amiodarone 200 MG tablet Commonly known as: PACERONE Take 1 tablet  (200 mg total) by mouth 2 (two) times daily for 13 days, THEN 1 tablet (200 mg total) daily. Start taking on: February 24, 2022   apixaban 5 MG Tabs tablet Commonly known as: ELIQUIS Take 1 tablet (5 mg total) by mouth 2 (two) times daily.   aspirin EC 81 MG tablet Take 1 tablet (81 mg total) by mouth daily. Swallow whole. Start taking on: February 25, 2022   atorvastatin 80 MG tablet Commonly known as: LIPITOR Take 1 tablet (80 mg total) by mouth daily. Start taking on: February 25, 2022   azaTHIOprine 50 MG tablet Commonly known as: IMURAN Take 50 mg by mouth daily.   empagliflozin 10 MG Tabs tablet Commonly known as: JARDIANCE Take 1 tablet (10 mg total) by mouth daily. Start taking on: February 25, 2022   losartan 25 MG tablet Commonly known as: COZAAR Take 1 tablet (25 mg total) by mouth daily. Start taking on: February 25, 2022   metoprolol succinate 50 MG 24 hr tablet Commonly known as: TOPROL-XL Take 1 tablet (50 mg total) by mouth daily. Take with or immediately following a meal. Start taking on: February 25, 2022   multivitamin tablet Take 1 tablet by mouth daily.   nitroGLYCERIN 0.4 MG SL tablet Commonly known as: NITROSTAT Place 1 tablet (0.4 mg total) under the tongue every 5 (five) minutes x 3 doses as needed for chest pain.   spironolactone 25 MG tablet Commonly known as: ALDACTONE Take 0.5 tablets (12.5 mg total) by mouth daily. Start taking on: February 25, 2022           Outstanding Labs/Studies   None  Duration of Discharge Encounter   Greater than 30 minutes including physician time.  Signed, Teola Bradley, MD 02/24/2022, 4:27 PM [id number]  Patient seen, examined. Available data reviewed. Agree with findings, assessment, and plan as outlined by Dr Jeanett Schlein.  The patient is independently interviewed and examined.  She is an elderly 88 year old woman in no acute distress.  HEENT is normal, JVP is normal, lungs are clear  bilaterally, heart is regular rate and rhythm with no murmur gallop, abdomen is soft and nontender, extremities have trace pretibial edema, skin is warm and dry with no rash.  Telemetry reveals normal sinus rhythm.  The patient is a little reluctant to take oral anticoagulation, but with new onset atrial fibrillation and high CHA2DS2-VASc score, she understands that her stroke risk far outweighs her bleeding risk.  She has no personal history of bleeding or blood transfusion.  The patient will be treated with apixaban as outlined above.  She has been started on low-dose aspirin since she presented with ACS with non-STEMI and total occlusion of the LAD.  She was not revascularized because of late presentation.  She is on  an excellent regimen of GDMT for treatment of new severe LV dysfunction with congestive heart failure.  Medical regimen includes losartan, metoprolol succinate, Jardiance, and spironolactone.  I agree that she should be transition to [facility] as an outpatient if her renal function and blood pressure are stable.  In general, the patient is very clear that she favors a conservative course to her care at age 25.  Outpatient follow-up will be arranged.  She is medically stable for discharge today.  Sherren Mocha, M.D. 02/24/2022 5:32 PM

## 2022-02-24 NOTE — TOC Progression Note (Addendum)
Transition of Care [facility]) - Progression Note    Patient Details  Name: JANCIE KERCHER MRN: [id number] Date of Birth: 11-Oct-1931  [facility] CM/SW Contact  Jacalyn Lefevre Edson Snowball, RN Phone Number: 02/24/2022, 11:02 AM  Clinical Narrative:     PT / OT recommending home health services . Spoke to patient at bedside. Provided medicare.gov list of home health agencies . Patient will discuss with her son Rush Landmark before deciding on agency. NCM will need to know prior to discharge her choice to make sure agency covers exact address, takes insurance and has staffing. Patient aware. Secure chatted nurse    [phone] Patient's daughter in law Wendi at bedside. She has already called [facility] for HHRN,PT,OT. NCM secure chatted Multani, Bhupinder for orders and face to face . NCM messaged Hoyle Sauer with [facility].  Expected Discharge Plan: Home/Self Care Barriers to Discharge: Continued Medical Work up  Expected Discharge Plan and Services Expected Discharge Plan: Home/Self Care   Discharge Planning Services: CM Consult   Living arrangements for the past 2 months: Single Family Home                 DME Arranged: N/A         HH Arranged: NA           Social Determinants of Health (SDOH) Interventions Food Insecurity Interventions: Intervention Not Indicated Housing Interventions: Intervention Not Indicated Transportation Interventions: Intervention Not Indicated Utilities Interventions: Intervention Not Indicated Alcohol Usage Interventions: Intervention Not Indicated (Score <7) Financial Strain Interventions: Intervention Not Indicated  Readmission Risk Interventions     No data to display

## 2022-02-24 NOTE — Progress Notes (Addendum)
CARDIAC REHAB PHASE I   PRE:  Rate/Rhythm: 68 NSR  BP:  Sitting: 141/68      SaO2: 95 RA  MODE:  Ambulation: 400 ft   POST:  Rate/Rhythm: 83 NSR  BP:  Sitting: 141/63      SaO2: 96 RA  Pt ambulated in the hallway with RW and contact guard assistance. During ambulation pt needed 3x standing rest breaks due to sob which resolved with pursed lip breathing. Pt was returned to bed w/o anginal symptoms. Pt received MI book, and was educated with son present on blockage location, collaterals, restrictions, ex guidelines, ntg use and calling 911, low na  and heart healthy diet, daily weights, recording symptoms, and CRPII. Pt is not interested in CRPII at this time. Referral sent to meet guidelines.   Christen Bame  9:39 AM 02/24/2022

## 2022-02-24 NOTE — Progress Notes (Addendum)
   Heart Failure Stewardship Pharmacist Progress Note   PCP: Donnajean Lopes, MD PCP-Cardiologist: None   HPI:   Ms. Oppedisano is an 88 yo female who presented with complaints of dizziness, weakness, and shortness of breath that started approximately three days prior to admission. Patient was found to have an NSTEMI with troponin elevated to 2400. Patient underwent left heart cath 02/20/22 revealing 100% stenosis of proximal to distal LAD with faint collaterals from the inferior septal perforating arteries and circumflex artery. Cardiology is planning medical management of CHF and CAD given patient preference for no aggressive interventions. Her stay has been complicated by atrial fibrillation with RVR, treated initially with diltiazem, amiodarone, and digoxin. Echocardiogram on 02/19/22 noted LVEF of 25-30%. Diltiazem and digoxin were discontinued. Converted to NSR on amiodarone drip.   Current HF Medications: Beta Blocker: metoprolol XL 50 mg daily ACE/ARB/ARNI: losartan 25 mg daily MRA: spironolactone 12.5 mg daily SGLT2i: Jardiance 10 mg daily  Prior to admission HF Medications: ACE/ARB/ARNI: olmesartan 40 mg daily  Pertinent Lab Values: Serum creatinine 1.14, BUN 19, Potassium 4.4, Sodium 135, BNP 1668.8 (11/1), Magnesium 2.3   Vital Signs: Weight: 173 lbs (admission weight: 179 lbs stated) Blood pressure: 120-140/60s  Heart rate: 60-70s NSR I/O: -371m yesterday; net +7038m Medication Assistance / Insurance Benefits Check: Does the patient have prescription insurance?  Yes [place] Prior to [place] outpatient pharmacy: [facility] in [place] the patient willing to use [facility] discharge? Yes Is the patient willing to transition their outpatient pharmacy [facility]?   No    Assessment: 1. Acute on chronic systolic CHF (LVEF 2581-85% due to ICM. NYHA class IV symptoms. -  Continue metoprolol XL 50 mg daily - On losartan 25 mg daily, consider optimizing to Entresto 24/26 mg BID, creatinine stable - Continue spironolactone 12.5 mg daily - Continue Jardiance 10 mg daily - Continue strict I/Os and maintain K >4 and Mg >2   Plan: 1) Medication changes recommended at this time: - Change losartan to Entresto 24/26 mg BID  2) Patient assistance: - Copay for EnFerne CoeEliquis, and Xarelto is $38 each  3)  Education  - Patient has been educated on current HF medications and potential additions to HF medication regimen - Patient verbalizes understanding that over the next few months, these medication doses may change and more medications may be added to optimize HF regimen - Patient has been educated on basic disease state pathophysiology and goals of therapy   MeKerby NoraPharmD, BCPS Heart Failure StCytogeneticisthone [phone]

## 2022-02-24 NOTE — Care Management Important Message (Signed)
Important Message  Patient Details  Name: [name] MRN: [id number] Date of Birth: 27-Jan-1932   Medicare Important Message Given:  Yes     Leny Morozov Montine Circle 02/24/2022, 3:41 PM

## 2022-02-25 ENCOUNTER — Other Ambulatory Visit (HOSPITAL_COMMUNITY): Payer: Self-pay

## 2022-02-26 DIAGNOSIS — Z6828 Body mass index (BMI) 28.0-28.9, adult: Secondary | ICD-10-CM | POA: Diagnosis not present

## 2022-02-26 DIAGNOSIS — Z7982 Long term (current) use of aspirin: Secondary | ICD-10-CM | POA: Diagnosis not present

## 2022-02-26 DIAGNOSIS — E785 Hyperlipidemia, unspecified: Secondary | ICD-10-CM | POA: Diagnosis not present

## 2022-02-26 DIAGNOSIS — Z7901 Long term (current) use of anticoagulants: Secondary | ICD-10-CM | POA: Diagnosis not present

## 2022-02-26 DIAGNOSIS — I255 Ischemic cardiomyopathy: Secondary | ICD-10-CM | POA: Diagnosis not present

## 2022-02-26 DIAGNOSIS — Z7984 Long term (current) use of oral hypoglycemic drugs: Secondary | ICD-10-CM | POA: Diagnosis not present

## 2022-02-26 DIAGNOSIS — N179 Acute kidney failure, unspecified: Secondary | ICD-10-CM | POA: Diagnosis not present

## 2022-02-26 DIAGNOSIS — I4891 Unspecified atrial fibrillation: Secondary | ICD-10-CM | POA: Diagnosis not present

## 2022-02-26 DIAGNOSIS — I214 Non-ST elevation (NSTEMI) myocardial infarction: Secondary | ICD-10-CM | POA: Diagnosis not present

## 2022-02-26 DIAGNOSIS — Z48812 Encounter for surgical aftercare following surgery on the circulatory system: Secondary | ICD-10-CM | POA: Diagnosis not present

## 2022-02-26 DIAGNOSIS — I2510 Atherosclerotic heart disease of native coronary artery without angina pectoris: Secondary | ICD-10-CM | POA: Diagnosis not present

## 2022-02-26 DIAGNOSIS — I5041 Acute combined systolic (congestive) and diastolic (congestive) heart failure: Secondary | ICD-10-CM | POA: Diagnosis not present

## 2022-02-28 DIAGNOSIS — I214 Non-ST elevation (NSTEMI) myocardial infarction: Secondary | ICD-10-CM | POA: Diagnosis not present

## 2022-02-28 DIAGNOSIS — N179 Acute kidney failure, unspecified: Secondary | ICD-10-CM | POA: Diagnosis not present

## 2022-02-28 DIAGNOSIS — Z48812 Encounter for surgical aftercare following surgery on the circulatory system: Secondary | ICD-10-CM | POA: Diagnosis not present

## 2022-02-28 DIAGNOSIS — I2510 Atherosclerotic heart disease of native coronary artery without angina pectoris: Secondary | ICD-10-CM | POA: Diagnosis not present

## 2022-02-28 DIAGNOSIS — I5041 Acute combined systolic (congestive) and diastolic (congestive) heart failure: Secondary | ICD-10-CM | POA: Diagnosis not present

## 2022-02-28 DIAGNOSIS — I4891 Unspecified atrial fibrillation: Secondary | ICD-10-CM | POA: Diagnosis not present

## 2022-03-05 DIAGNOSIS — E785 Hyperlipidemia, unspecified: Secondary | ICD-10-CM | POA: Diagnosis not present

## 2022-03-05 DIAGNOSIS — I5041 Acute combined systolic (congestive) and diastolic (congestive) heart failure: Secondary | ICD-10-CM | POA: Diagnosis not present

## 2022-03-05 DIAGNOSIS — E039 Hypothyroidism, unspecified: Secondary | ICD-10-CM | POA: Diagnosis not present

## 2022-03-05 DIAGNOSIS — I4891 Unspecified atrial fibrillation: Secondary | ICD-10-CM | POA: Diagnosis not present

## 2022-03-05 DIAGNOSIS — I214 Non-ST elevation (NSTEMI) myocardial infarction: Secondary | ICD-10-CM | POA: Diagnosis not present

## 2022-03-05 DIAGNOSIS — I129 Hypertensive chronic kidney disease with stage 1 through stage 4 chronic kidney disease, or unspecified chronic kidney disease: Secondary | ICD-10-CM | POA: Diagnosis not present

## 2022-03-05 DIAGNOSIS — N1832 Chronic kidney disease, stage 3b: Secondary | ICD-10-CM | POA: Diagnosis not present

## 2022-03-07 DIAGNOSIS — I5041 Acute combined systolic (congestive) and diastolic (congestive) heart failure: Secondary | ICD-10-CM | POA: Diagnosis not present

## 2022-03-07 DIAGNOSIS — N179 Acute kidney failure, unspecified: Secondary | ICD-10-CM | POA: Diagnosis not present

## 2022-03-07 DIAGNOSIS — I2510 Atherosclerotic heart disease of native coronary artery without angina pectoris: Secondary | ICD-10-CM | POA: Diagnosis not present

## 2022-03-07 DIAGNOSIS — I4891 Unspecified atrial fibrillation: Secondary | ICD-10-CM | POA: Diagnosis not present

## 2022-03-07 DIAGNOSIS — I214 Non-ST elevation (NSTEMI) myocardial infarction: Secondary | ICD-10-CM | POA: Diagnosis not present

## 2022-03-07 DIAGNOSIS — Z48812 Encounter for surgical aftercare following surgery on the circulatory system: Secondary | ICD-10-CM | POA: Diagnosis not present

## 2022-03-10 ENCOUNTER — Encounter: Payer: Self-pay | Admitting: Nurse Practitioner

## 2022-03-10 ENCOUNTER — Ambulatory Visit: Payer: Medicare Other | Attending: Nurse Practitioner | Admitting: Nurse Practitioner

## 2022-03-10 VITALS — BP 126/56 | HR 61 | Ht 64.5 in | Wt 173.4 lb

## 2022-03-10 DIAGNOSIS — I5042 Chronic combined systolic (congestive) and diastolic (congestive) heart failure: Secondary | ICD-10-CM

## 2022-03-10 DIAGNOSIS — I2510 Atherosclerotic heart disease of native coronary artery without angina pectoris: Secondary | ICD-10-CM

## 2022-03-10 DIAGNOSIS — I480 Paroxysmal atrial fibrillation: Secondary | ICD-10-CM

## 2022-03-10 DIAGNOSIS — I10 Essential (primary) hypertension: Secondary | ICD-10-CM

## 2022-03-10 DIAGNOSIS — I255 Ischemic cardiomyopathy: Secondary | ICD-10-CM | POA: Diagnosis not present

## 2022-03-10 DIAGNOSIS — E785 Hyperlipidemia, unspecified: Secondary | ICD-10-CM | POA: Diagnosis not present

## 2022-03-10 NOTE — Progress Notes (Signed)
Office Visit    Patient Name: [name] Date of Encounter: 03/10/2022  Primary Care Provider:  Donnajean Lopes, MD Primary Cardiologist:  None  Chief Complaint    88 year old female with a history of CAD, chronic combined systolic and diastolic heart failure, ICM, paroxysmal atrial fibrillation, hypertension, and hyperlipidemia who presents for for hospital follow-up related to CAD and atrial fibrillation.  Past Medical History    No past medical history on file. Past Surgical History:  Procedure Laterality Date   LEFT HEART CATH AND CORONARY ANGIOGRAPHY N/A 02/20/2022   Procedure: LEFT HEART CATH AND CORONARY ANGIOGRAPHY;  Surgeon: Troy Sine, MD;  Location: [facility];  Service: Cardiovascular;  Laterality: N/A;    Allergies  Allergies  Allergen Reactions   Bromfenac Other (See Comments)    Hives, welts   Lisinopril     Other Reaction(s): arthralgias   Meloxicam Other (See Comments)    unknown   Tramadol Hcl     Other Reaction(s): ineffective   Valacyclovir     GI Upset    History of Present Illness    88 year old female with the above past medical history including CAD, combined systolic and diastolic heart failure, ICM, paroxysmal atrial fibrillation, hypertension, and hyperlipidemia.  She presented to the ED on 02/19/2022 with a 1 week history of dyspnea on exertion, lightheadedness, and fatigue.  Troponin was elevated.  Echocardiogram showed EF 30%, regional LV wall motion abnormalities.  BNP was elevated at 1168. Cardiac catheterization in the setting of NSTEMI revealed total occlusion of proximal LAD, otherwise mild nonobstructive disease.  LVEDP was normal on cath.  Medical therapy was recommended.  She was started on ASA and Lipitor. Her hospital course was complicated by new onset atrial fibrillation with RVR, acute on chronic combined systolic and diastolic heart failure.  She was diuresed with IV Lasix.  GDMT was initiated with metoprolol,  losartan, spironolactone, and Jardiance.  Transition to [facility] was recommended in the outpatient setting.  Initially, she was started on Amiodarone and Eliquis.  She converted to NSR with amiodarone.  She was discharged home in stable condition on 02/24/2022.  She presents today for follow-up accompanied by her son.  Since her hospitalization she has been stable overall from a cardiac standpoint.  She does note some generalized fatigue, low energy as her hospital discharge.  She denies any chest pain, palpitations, dyspnea, edema, PND, orthopnea, or weight gain.  She states she feels like she is on "too much medication" and is asking when she will be able to discontinue some of her current medications.  She states "I am ready to go when it is my time." "I am almost 88 years old and "I do not want to live forever."  However, she is not interested in a palliative care consult at this time.  Home Medications    Current Outpatient Medications  Medication Sig Dispense Refill   amiodarone (PACERONE) 200 MG tablet Take 1 tablet (200 mg total) by mouth 2 (two) times daily for 13 days, THEN 1 tablet (200 mg total) daily. 86 tablet 0   apixaban (ELIQUIS) 5 MG TABS tablet Take 1 tablet (5 mg total) by mouth 2 (two) times daily. 180 tablet 0   aspirin EC 81 MG tablet Take 1 tablet (81 mg total) by mouth daily. Swallow whole. 30 tablet 12   atorvastatin (LIPITOR) 80 MG tablet Take 1 tablet (80 mg total) by mouth daily. 90 tablet 0   azaTHIOprine (IMURAN) 50  MG tablet Take 50 mg by mouth daily.     empagliflozin (JARDIANCE) 10 MG TABS tablet Take 1 tablet (10 mg total) by mouth daily. 90 tablet 0   losartan (COZAAR) 25 MG tablet Take 1 tablet (25 mg total) by mouth daily. 90 tablet 0   metoprolol succinate (TOPROL-XL) 50 MG 24 hr tablet Take 1 tablet (50 mg total) by mouth daily. Take with or immediately following a meal. 90 tablet 0   Multiple Vitamin (MULTIVITAMIN) tablet Take 1 tablet by mouth daily.      nitroGLYCERIN (NITROSTAT) 0.4 MG SL tablet Place 1 tablet (0.4 mg total) under the tongue every 5 (five) minutes x 3 doses as needed for chest pain. 30 tablet 1   spironolactone (ALDACTONE) 25 MG tablet Take 0.5 tablets (12.5 mg total) by mouth daily. 45 tablet 0   No current facility-administered medications for this visit.     Review of Systems    She denies chest pain, palpitations, dyspnea, pnd, orthopnea, n, v, dizziness, syncope, edema, weight gain, or early satiety. All other systems reviewed and are otherwise negative except as noted above.   Physical Exam    VS:  BP (!) 126/56   Pulse 61   Ht 5' 4.5" (1.638 m)   Wt 173 lb 6.4 oz (78.7 kg)   SpO2 95%   BMI 29.30 kg/m    GEN: Well nourished, well developed, in no acute distress. HEENT: normal. Neck: Supple, no JVD, carotid bruits, or masses. Cardiac: RRR, no murmurs, rubs, or gallops. No clubbing, cyanosis, edema.  Radials/DP/PT 2+ and equal bilaterally.  Respiratory:  Respirations regular and unlabored, clear to auscultation bilaterally. GI: Soft, nontender, nondistended, BS + x 4. MS: no deformity or atrophy. Skin: warm and dry, no rash. Neuro:  Strength and sensation are intact. Psych: Normal affect.  Accessory Clinical Findings    ECG personally reviewed by me today -NSR, 61 bpm, incomplete LBBB- no acute changes.   Lab Results  Component Value Date   WBC 7.1 02/24/2022   HGB 11.6 (L) 02/24/2022   HCT 34.2 (L) 02/24/2022   MCV 87.0 02/24/2022   PLT 371 02/24/2022   Lab Results  Component Value Date   CREATININE 1.14 (H) 02/24/2022   BUN 19 02/24/2022   NA 135 02/24/2022   K 4.4 02/24/2022   CL 105 02/24/2022   CO2 22 02/24/2022   Lab Results  Component Value Date   ALT 17 02/20/2022   AST 30 02/20/2022   ALKPHOS 60 02/20/2022   BILITOT 0.9 02/20/2022   Lab Results  Component Value Date   CHOL 142 02/20/2022   HDL 35 (L) 02/20/2022   LDLCALC 95 02/20/2022   TRIG 58 02/20/2022   CHOLHDL 4.1  02/20/2022    No results found for: "HGBA1C"  Assessment & Plan   1. CAD: S/p recent NSTEMI. Cath revealed normal occlusion of proximal LAD, otherwise mild  nonobstructive disease. Stable with no anginal symptoms.  Continue aspirin, losartan, metoprolol, spironolactone, Jardiance, and Lipitor.  2. Chronic combined systolic and diastolic heart failure/ICM: Echo showed EF 30%, regional LV wall motion abnormalities. Euvolemic and well compensated on exam.  Indication for Lasix at this time.  She is asking when she will be able to discontinue some of her medications.  We discussed the importance of GDMT in relation to heart failure, discussed possible repeat echocardiogram in the future.  Patient increasingly frustrated during the conversation and states it is "too much for someone my age."  I  discussed possible palliative care consult to addressed potential de-escalation of medication, goals of care.  Patient declines.  However, patient is hesitant to undergo additional testing.  She would likely benefit from ongoing goals of care discussion. Will check BMET today.  Continue current medications as above.  3. Paroxysmal atrial fibrillation: Diagnosed during recent hospitalization.  She is maintaining NSR.  Continue metoprolol, amiodarone, Eliquis.  Explained the need for annual chest x-ray, eye exam, discussed need for labs including TSH, LFTs, and CBC in the future. Again, patient is hesitant to undergo additional labs, testing.  She did agree to CBC today.  4. Hypertension: BP well controlled. Continue current antihypertensive regimen.   5. Hyperlipidemia: LDL was 95 in 02/2022.  Recently started on Lipitor.  She will be due for fasting lipids, LFTs in 6 to 8 weeks, however, patient does not wish to schedule this at this time.  Continue aspirin, Lipitor.  6. Disposition: Follow-up in 2 to 3 months, MD only.     Lenna Sciara, NP 03/10/2022, 5:24 PM

## 2022-03-10 NOTE — Patient Instructions (Signed)
Medication Instructions:  Your physician recommends that you continue on your current medications as directed. Please refer to the Current Medication list given to you today.   *If you need a refill on your cardiac medications before your next appointment, please call your pharmacy*   Lab Work: Your physician recommends that you complete lab work today BMET, CBC  If you have labs (blood work) drawn today and your tests are completely normal, you will receive your results only by: MyChart Message (if you have MyChart) OR A paper copy in the mail If you have any lab test that is abnormal or we need to change your treatment, we will call you to review the results.   Testing/Procedures: NONE ordered at this time of appointment     Follow-Up: At [facility], you and your health needs are our priority.  As part of our continuing mission to provide you with exceptional heart care, we have created designated Provider Care Teams.  These Care Teams include your primary Cardiologist (physician) and Advanced Practice Providers (APPs -  Physician Assistants and Nurse Practitioners) who all work together to provide you with the care you need, when you need it.  We recommend signing up for the patient portal called "MyChart".  Sign up information is provided on this After Visit Summary.  MyChart is used to connect with patients for Virtual Visits (Telemedicine).  Patients are able to view lab/test results, encounter notes, upcoming appointments, etc.  Non-urgent messages can be sent to your provider as well.   To learn more about what you can do with MyChart, go to [email or web address].    Your next appointment:   2-3 month(s)  The format for your next appointment:   In Person  Provider:   None     Other Instructions   Important Information About Sugar

## 2022-03-11 ENCOUNTER — Telehealth (HOSPITAL_COMMUNITY): Payer: Self-pay | Admitting: *Deleted

## 2022-03-11 ENCOUNTER — Telehealth (HOSPITAL_COMMUNITY): Payer: Self-pay | Admitting: Cardiology

## 2022-03-11 ENCOUNTER — Ambulatory Visit (HOSPITAL_COMMUNITY)
Admit: 2022-03-11 | Discharge: 2022-03-11 | Disposition: A | Discharge status: Home / Self Care | Payer: Medicare Other | Attending: Cardiology | Admitting: Cardiology

## 2022-03-11 ENCOUNTER — Encounter (HOSPITAL_COMMUNITY): Payer: Self-pay

## 2022-03-11 VITALS — BP 122/68 | HR 61 | Wt 172.2 lb

## 2022-03-11 DIAGNOSIS — Z79624 Long term (current) use of inhibitors of nucleotide synthesis: Secondary | ICD-10-CM | POA: Diagnosis not present

## 2022-03-11 DIAGNOSIS — E875 Hyperkalemia: Secondary | ICD-10-CM | POA: Diagnosis not present

## 2022-03-11 DIAGNOSIS — N179 Acute kidney failure, unspecified: Secondary | ICD-10-CM | POA: Insufficient documentation

## 2022-03-11 DIAGNOSIS — I2510 Atherosclerotic heart disease of native coronary artery without angina pectoris: Secondary | ICD-10-CM | POA: Insufficient documentation

## 2022-03-11 DIAGNOSIS — I252 Old myocardial infarction: Secondary | ICD-10-CM | POA: Diagnosis not present

## 2022-03-11 DIAGNOSIS — I480 Paroxysmal atrial fibrillation: Secondary | ICD-10-CM | POA: Insufficient documentation

## 2022-03-11 DIAGNOSIS — Z7984 Long term (current) use of oral hypoglycemic drugs: Secondary | ICD-10-CM | POA: Diagnosis not present

## 2022-03-11 DIAGNOSIS — Z7982 Long term (current) use of aspirin: Secondary | ICD-10-CM | POA: Insufficient documentation

## 2022-03-11 DIAGNOSIS — Z79899 Other long term (current) drug therapy: Secondary | ICD-10-CM | POA: Insufficient documentation

## 2022-03-11 DIAGNOSIS — Z87891 Personal history of nicotine dependence: Secondary | ICD-10-CM | POA: Diagnosis not present

## 2022-03-11 DIAGNOSIS — I214 Non-ST elevation (NSTEMI) myocardial infarction: Secondary | ICD-10-CM | POA: Insufficient documentation

## 2022-03-11 DIAGNOSIS — Z7901 Long term (current) use of anticoagulants: Secondary | ICD-10-CM | POA: Diagnosis not present

## 2022-03-11 DIAGNOSIS — I5022 Chronic systolic (congestive) heart failure: Secondary | ICD-10-CM | POA: Insufficient documentation

## 2022-03-11 DIAGNOSIS — I5042 Chronic combined systolic (congestive) and diastolic (congestive) heart failure: Secondary | ICD-10-CM

## 2022-03-11 LAB — BRAIN NATRIURETIC PEPTIDE: B Natriuretic Peptide: 1645.8 pg/mL — ABNORMAL HIGH (ref 0.0–100.0)

## 2022-03-11 LAB — BASIC METABOLIC PANEL
Anion gap: 10 (ref 5–15)
BUN/Creatinine Ratio: 18 (ref 12–28)
BUN: 30 mg/dL — ABNORMAL HIGH (ref 8–27)
BUN: 27 mg/dL — ABNORMAL HIGH (ref 8–23)
CO2: 20 mmol/L (ref 20–29)
CO2: 22 mmol/L (ref 22–32)
Calcium: 9.5 mg/dL (ref 8.7–10.3)
Calcium: 9.1 mg/dL (ref 8.9–10.3)
Chloride: 100 mmol/L (ref 96–106)
Chloride: 105 mmol/L (ref 98–111)
Creatinine, Ser: 1.7 mg/dL — ABNORMAL HIGH (ref 0.57–1.00)
Creatinine, Ser: 1.58 mg/dL — ABNORMAL HIGH (ref 0.44–1.00)
GFR, Estimated: 31 mL/min — ABNORMAL LOW (ref >60)
Glucose, Bld: 102 mg/dL — ABNORMAL HIGH (ref 70–99)
Glucose: 95 mg/dL (ref 70–99)
Potassium: 5.4 mmol/L — ABNORMAL HIGH (ref 3.5–5.2)
Potassium: 5.1 mmol/L (ref 3.5–5.1)
Sodium: 135 mmol/L (ref 134–144)
Sodium: 137 mmol/L (ref 135–145)
eGFR: 29 mL/min/{1.73_m2} — ABNORMAL LOW (ref >59)

## 2022-03-11 LAB — CBC
Hematocrit: 34.9 % (ref 34.0–46.6)
Hemoglobin: 11.4 g/dL (ref 11.1–15.9)
MCH: 28.6 pg (ref 26.6–33.0)
MCHC: 32.7 g/dL (ref 31.5–35.7)
MCV: 88 fL (ref 79–97)
Platelets: 459 10*3/uL — ABNORMAL HIGH (ref 150–450)
RBC: 3.98 x10E6/uL (ref 3.77–5.28)
RDW: 13.1 % (ref 11.7–15.4)
WBC: 9 10*3/uL (ref 3.4–10.8)

## 2022-03-11 MED ORDER — APIXABAN 2.5 MG PO TABS: 2.5000 mg | ORAL_TABLET | Freq: Two times a day (BID) | ORAL | 0 refills | Status: DC

## 2022-03-11 MED ORDER — PANTOPRAZOLE SODIUM 20 MG PO TBEC: 20.0000 mg | DELAYED_RELEASE_TABLET | Freq: Every day | ORAL | 0 refills | Status: DC

## 2022-03-11 MED ORDER — FUROSEMIDE 20 MG PO TABS: 20.0000 mg | ORAL_TABLET | Freq: Every day | ORAL | 11 refills | Status: AC | PRN

## 2022-03-11 NOTE — Telephone Encounter (Signed)
-----   Message from Consuelo Pandy, [place] sent at 03/11/2022 12:46 PM EST ----- SCr trending down. K upper limits of normal. Fluid marker elevated. Spironolactone discontinued.   She needs to take Lasix 20 mg daily x 3 days, then reduce to PRN.  F/u BMP and BNP in 1 wk

## 2022-03-11 NOTE — Telephone Encounter (Signed)
Called to confirm Heart & Vascular Transitions of Care appointment at 9 am on 03/11/22. Patient reminded to bring all medications and pill box organizer with them. Confirmed patient has transportation. Gave directions, instructed to utilize [address] parking.  Confirmed appointment prior to ending call.    Earnestine Leys, BSN, Clinical cytogeneticist Only

## 2022-03-11 NOTE — Patient Instructions (Addendum)
Labs done today. We will contact you only if your labs are abnormal.  STOP taking Spironolactone  DECREASE Eliquis to 2.'5mg'$  (1 tablet) by mouth 2 times daily.   START Pantoprazole (Protonix) '20mg'$  (1 tablet) by mouth daily.   START  Lasix '20mg'$  (1 tablet) as needed for swelling or a weight gain of 3 pounds or more in 24 hours.   No other medication changes were made. Please continue all current medications as prescribed.  Thank you for allowing [place] to provide your heart failure care after your recent hospitalization. Please follow-up with General Cardiology.

## 2022-03-11 NOTE — Telephone Encounter (Signed)
Patient called.  Patient aware.  

## 2022-03-11 NOTE — Progress Notes (Signed)
HEART & VASCULAR TRANSITION OF CARE CONSULT NOTE     Referring Physician: Dr. Burt Knack   Primary Care: Donnajean Lopes, MD Primary Cardiologist: Dr. Gardiner Rhyme    HPI: Referred to clinic by Dr. Burt Knack for heart failure consultation.   88 y/o female recently admitted w/ late presentation MI. Ruled in for NSTEMI. Hs trop 1275 -> 2268. Echo w/ severely reduced LVEF 25-30%. RV normal. LHC showed total occlusion of pLAD. There was faint collateralization via inferior septal perforating arteries to LAD proximal to mid septals as well as from the circumflex marginal to distal LAD, mild nonobstructive disease involving the distal LM and LCx, normal dominant RCA. LVEDP 13 mmHg. Given late presentation (suspected MI had occurred over the past 1.5-2 wks), medical therapy of CAD was recommended.   Hospital course was further c/b new onset atrial fibrillation. She converted to NSR w/ amiodarone. Started on Eliquis for a/c. ASA w/o Plavix for CAD. High intensity statin added. GDMT for systolic heart failure initiated. Started on Jardiance, losartan, spironolactone and Toprol XL. Discharged wt 172 lb. Referred to [facility] clinic.   Seen in cardiology clinic yesterday for f/u. W/o CP. EKG showed NSR w/ PACs. Pt refused GDMT titration. Meds continued at discharge doses. BMP was done, showing AKI and hyperkalemia. SCr 1.14>>1.70. K 5.4.   Presents to [facility] w/ son. Lives home alone but 2 sons live down the street. Ambulates w/ walker. Doing fairly well. Denies CP. No resting dyspnea. Minimal exertional dyspnea w/ basic ADLs. No LEE. Wt stable post d/c at 172 lb. Says mouth feels dry.   Denies abnormal bleeding w/ ASA/Eliquis.     Cardiac Testing  2D Echo 02/19/22 Left ventricular ejection fraction, by estimation, is 25 to 30%. The left ventricle has severely decreased function. The left ventricle demonstrates regional wall motion abnormalities with mid to apical anteroseptal and anterior, apical inferior,  apical lateral, and true apex akinesis. No LV thrombus noted. Findings consistent with LAD territory infarction. Left ventricular diastolic parameters are indeterminate. 1. Right ventricular systolic function is normal. The right ventricular size is normal. Tricuspid regurgitation signal is inadequate for assessing PA pressure. 2. 3. Left atrial size was mildly dilated. The mitral valve is normal in structure. Trivial mitral valve regurgitation. No evidence of mitral stenosis. 4. The aortic valve is normal in structure. Aortic valve regurgitation is not visualized. No aortic stenosis is present. 5. The inferior vena cava is normal in size with <50% respiratory variability, suggesting right atrial pressure of 8 mmHg. 6. 7. The patient was in atrial fibrillation.   LHC 02/20/22    Mid LM to Dist LM lesion is 20% stenosed.   Prox LAD to Mid LAD lesion is 100% stenosed.   Mid LAD to Dist LAD lesion is 100% stenosed.   Ost Cx to Prox Cx lesion is 20% stenosed.   1st Mrg lesion is 30% stenosed.   Mid Cx lesion is 20% stenosed.   Dist Cx lesion is 20% stenosed.   Probable recent total occlusion of the proximal LAD after a small proximal septal perforating artery.  There is faint collateralization via inferior septal perforating arteries to LAD proximal to mid septals as well as from the circumflex marginal to distal LAD.   Mild nonobstructive disease involving the distal left main, and left circumflex coronary artery.   Normal dominant RCA.   LVEDP 13 mmHg.  Review of Systems: [y] = yes, '[ ]'$  = no   General: Weight gain '[ ]'$ ; Weight loss '[ ]'$ ;  Anorexia '[ ]'$ ; Fatigue '[ ]'$ ; Fever '[ ]'$ ; Chills '[ ]'$ ; Weakness '[ ]'$   Cardiac: Chest pain/pressure '[ ]'$ ; Resting SOB '[ ]'$ ; Exertional SOB '[ ]'$ ; Orthopnea '[ ]'$ ; Pedal Edema '[ ]'$ ; Palpitations '[ ]'$ ; Syncope '[ ]'$ ; Presyncope '[ ]'$ ; Paroxysmal nocturnal dyspnea'[ ]'$   Pulmonary: Cough '[ ]'$ ; Wheezing'[ ]'$ ; Hemoptysis'[ ]'$ ; Sputum '[ ]'$ ; Snoring '[ ]'$   GI: Vomiting'[ ]'$ ;  Dysphagia'[ ]'$ ; Melena'[ ]'$ ; Hematochezia '[ ]'$ ; Heartburn'[ ]'$ ; Abdominal pain '[ ]'$ ; Constipation '[ ]'$ ; Diarrhea '[ ]'$ ; BRBPR '[ ]'$   GU: Hematuria'[ ]'$ ; Dysuria '[ ]'$ ; Nocturia'[ ]'$   Vascular: Pain in legs with walking '[ ]'$ ; Pain in feet with lying flat '[ ]'$ ; Non-healing sores '[ ]'$ ; Stroke '[ ]'$ ; TIA '[ ]'$ ; Slurred speech '[ ]'$ ;  Neuro: Headaches'[ ]'$ ; Vertigo'[ ]'$ ; Seizures'[ ]'$ ; Paresthesias'[ ]'$ ;Blurred vision '[ ]'$ ; Diplopia '[ ]'$ ; Vision changes '[ ]'$   Ortho/Skin: Arthritis '[ ]'$ ; Joint pain '[ ]'$ ; Muscle pain '[ ]'$ ; Joint swelling '[ ]'$ ; Back Pain '[ ]'$ ; Rash '[ ]'$   Psych: Depression'[ ]'$ ; Anxiety'[ ]'$   Heme: Bleeding problems '[ ]'$ ; Clotting disorders '[ ]'$ ; Anemia '[ ]'$   Endocrine: Diabetes '[ ]'$ ; Thyroid dysfunction'[ ]'$    No past medical history on file.  Current Outpatient Medications  Medication Sig Dispense Refill   amiodarone (PACERONE) 200 MG tablet Take 200 mg by mouth daily.     aspirin EC 81 MG tablet Take 1 tablet (81 mg total) by mouth daily. Swallow whole. 30 tablet 12   atorvastatin (LIPITOR) 80 MG tablet Take 1 tablet (80 mg total) by mouth daily. 90 tablet 0   azaTHIOprine (IMURAN) 50 MG tablet Take 50 mg by mouth daily.     empagliflozin (JARDIANCE) 10 MG TABS tablet Take 1 tablet (10 mg total) by mouth daily. 90 tablet 0   furosemide (LASIX) 20 MG tablet Take 1 tablet (20 mg total) by mouth daily as needed for fluid or edema. 30 tablet 11   losartan (COZAAR) 25 MG tablet Take 1 tablet (25 mg total) by mouth daily. 90 tablet 0   metoprolol succinate (TOPROL-XL) 50 MG 24 hr tablet Take 1 tablet (50 mg total) by mouth daily. Take with or immediately following a meal. 90 tablet 0   Multiple Vitamin (MULTIVITAMIN) tablet Take 1 tablet by mouth daily.     nitroGLYCERIN (NITROSTAT) 0.4 MG SL tablet Place 1 tablet (0.4 mg total) under the tongue every 5 (five) minutes x 3 doses as needed for chest pain. 30 tablet 1   pantoprazole (PROTONIX) 20 MG tablet Take 1 tablet (20 mg total) by mouth daily. 90 tablet 0   apixaban (ELIQUIS) 2.5 MG TABS  tablet Take 1 tablet (2.5 mg total) by mouth 2 (two) times daily. 180 tablet 0   No current facility-administered medications for this encounter.    Allergies  Allergen Reactions   Bromfenac Other (See Comments)    Hives, welts   Lisinopril     Other Reaction(s): arthralgias   Meloxicam Other (See Comments)    unknown   Tramadol Hcl     Other Reaction(s): ineffective   Valacyclovir     GI Upset      Social History   Socioeconomic History   Marital status: Widowed    Spouse name: Not on file   Number of children: 7   Years of education: Not on file   Highest education level: High school graduate  Occupational History   Occupation: Retired  Tobacco Use   Smoking status: Former  Years: 3.00    Types: Cigarettes   Smokeless tobacco: Never  Vaping Use   Vaping Use: Never used  Substance and Sexual Activity   Alcohol use: Never   Drug use: Never   Sexual activity: Not on file  Other Topics Concern   Not on file  Social History Narrative   Not on file   Social Determinants of Health   Financial Resource Strain: Low Risk  (02/21/2022)   Overall Financial Resource Strain (CARDIA)    Difficulty of Paying Living Expenses: Not very hard  Food Insecurity: No Food Insecurity (02/21/2022)   Hunger Vital Sign    Worried About Running Out of Food in the Last Year: Never true    Ran Out of Food in the Last Year: Never true  Transportation Needs: No Transportation Needs (02/21/2022)   PRAPARE - Hydrologist (Medical): No    Lack of Transportation (Non-Medical): No  Physical Activity: Not on file  Stress: Not on file  Social Connections: Not on file  Intimate Partner Violence: Not At Risk (02/22/2022)   Humiliation, Afraid, Rape, and Kick questionnaire    Fear of Current or Ex-Partner: No    Emotionally Abused: No    Physically Abused: No    Sexually Abused: No     No family history on file.  Vitals:   03/11/22 0938  BP: 122/68  Pulse:  61  SpO2: 95%  Weight: 78.1 kg (172 lb 3.2 oz)    PHYSICAL EXAM: General:  Well appearing, elderly WF, ambulating w/ walker. No respiratory difficulty HEENT: normal Neck: supple. no JVD. Carotids 2+ bilat; no bruits. No lymphadenopathy or thryomegaly appreciated. Cor: PMI nondisplaced. Regular rate & rhythm. No rubs, gallops or murmurs. Lungs: clear Abdomen: soft, nontender, nondistended. No hepatosplenomegaly. No bruits or masses. Good bowel sounds. Extremities: no cyanosis, clubbing, rash, edema Neuro: alert & oriented x 3, cranial nerves grossly intact. moves all 4 extremities w/o difficulty. Affect pleasant.  ECG: Not performed. RRR on exam. EKG from 11/20 reviewed, NSR w/ PACs    ASSESSMENT & PLAN:  1. Chronic Systolic Heart Failure - Echo 11/23 EF 25-30%.  - ICM. Recent NSTEMI. Cath w/ CTO of prox LAD w/ faint collateralization via inferior septal perforating arteries to LAD proximal to mid septals as well as from the circumflex marginal to distal LAD.  - NYHA Class II-early III, confounded by advanced age and deconditioning  - Euvolemic on exam, suspect a little dry based on BMP yesterday, showing AKI and hyperkalemia - Stop Spironolactone. Repeat BMP today to ensure she does not need dose of Lokelma  - Continue Jardiance 10 mg  - Continue Losartan 25 mg daily - Continue Toprol XL 50 mg daily - Rx given for PRN Lasix, 20 mg (discussed daily wts) - Repeat BMP again in 1 wk. May need further dose reduction of losartan   2. CAD - recent late presenting NSTEMI w/ occluded pLAD w/ collaterals as outlined above, managed medically  - denies CP - continue ASA. No Plavix given need for Eliquis for Afib. Add Protonix 20 mg daily for GI protection  - continue high intensity statin, atorva 80. Gen cards to follow lipids/LFTs - continue ? blocker  3. PAF -  RRR on exam, EKG yesterday showed NSR w/ PACs - continue amiodarone 200 mg daily. Cardiology to follow LFTs/TFTs - continue  Eliquis. Given bump in SCr > 1.5 and age > 1, will dose reduce to 2.5 mg bid  4. AKI/Hyperkalemia - BMP yesterday, SCr bumped 1.14>>1.70. K 5.4 - Stop Spironolactone - Repeat BMP today to ensure she does not need Lokelma    NYHA II GDMT  Diuretic- Lasix 20 mg PRN  BB- Toprol XL 50 mg  Ace/ARB/ARNI Losartan 25 mg  MRA No- hyperkalemia SGLT2i Jardiance 10 mg     Referred to HFSW (PCP, Medications, Transportation, ETOH Abuse, Drug Abuse, Insurance, Financial ): No  Refer to Pharmacy:  No Refer to Home Health: No  Refer to Advanced Heart Failure Clinic: No  Refer to General Cardiology: Yes   Follow up  w/ Dr. Gardiner Rhyme

## 2022-03-12 DIAGNOSIS — I5041 Acute combined systolic (congestive) and diastolic (congestive) heart failure: Secondary | ICD-10-CM | POA: Diagnosis not present

## 2022-03-12 DIAGNOSIS — I214 Non-ST elevation (NSTEMI) myocardial infarction: Secondary | ICD-10-CM | POA: Diagnosis not present

## 2022-03-12 DIAGNOSIS — I4891 Unspecified atrial fibrillation: Secondary | ICD-10-CM | POA: Diagnosis not present

## 2022-03-12 DIAGNOSIS — N179 Acute kidney failure, unspecified: Secondary | ICD-10-CM | POA: Diagnosis not present

## 2022-03-12 DIAGNOSIS — Z48812 Encounter for surgical aftercare following surgery on the circulatory system: Secondary | ICD-10-CM | POA: Diagnosis not present

## 2022-03-12 DIAGNOSIS — I2510 Atherosclerotic heart disease of native coronary artery without angina pectoris: Secondary | ICD-10-CM | POA: Diagnosis not present

## 2022-03-19 DIAGNOSIS — I2510 Atherosclerotic heart disease of native coronary artery without angina pectoris: Secondary | ICD-10-CM | POA: Diagnosis not present

## 2022-03-19 DIAGNOSIS — I5041 Acute combined systolic (congestive) and diastolic (congestive) heart failure: Secondary | ICD-10-CM | POA: Diagnosis not present

## 2022-03-19 DIAGNOSIS — Z48812 Encounter for surgical aftercare following surgery on the circulatory system: Secondary | ICD-10-CM | POA: Diagnosis not present

## 2022-03-19 DIAGNOSIS — N179 Acute kidney failure, unspecified: Secondary | ICD-10-CM | POA: Diagnosis not present

## 2022-03-19 DIAGNOSIS — I4891 Unspecified atrial fibrillation: Secondary | ICD-10-CM | POA: Diagnosis not present

## 2022-03-19 DIAGNOSIS — I214 Non-ST elevation (NSTEMI) myocardial infarction: Secondary | ICD-10-CM | POA: Diagnosis not present

## 2022-03-20 ENCOUNTER — Telehealth (HOSPITAL_COMMUNITY): Payer: Self-pay

## 2022-03-20 ENCOUNTER — Ambulatory Visit (HOSPITAL_COMMUNITY)
Admission: RE | Admit: 2022-03-20 | Discharge: 2022-03-20 | Disposition: A | Discharge status: Home / Self Care | Payer: Medicare Other | Source: Ambulatory Visit | Attending: Cardiology | Admitting: Cardiology

## 2022-03-20 DIAGNOSIS — I5042 Chronic combined systolic (congestive) and diastolic (congestive) heart failure: Secondary | ICD-10-CM | POA: Diagnosis not present

## 2022-03-20 LAB — BASIC METABOLIC PANEL
Anion gap: 8 (ref 5–15)
BUN: 19 mg/dL (ref 8–23)
CO2: 23 mmol/L (ref 22–32)
Calcium: 9.2 mg/dL (ref 8.9–10.3)
Chloride: 107 mmol/L (ref 98–111)
Creatinine, Ser: 1.38 mg/dL — ABNORMAL HIGH (ref 0.44–1.00)
GFR, Estimated: 37 mL/min — ABNORMAL LOW (ref >60)
Glucose, Bld: 103 mg/dL — ABNORMAL HIGH (ref 70–99)
Potassium: 5.2 mmol/L — ABNORMAL HIGH (ref 3.5–5.1)
Sodium: 138 mmol/L (ref 135–145)

## 2022-03-20 MED ORDER — LOSARTAN POTASSIUM 25 MG PO TABS: 12.5000 mg | ORAL_TABLET | Freq: Every day | ORAL | 1 refills | Status: DC

## 2022-03-20 NOTE — Telephone Encounter (Addendum)
Pt aware, agreeable, and verbalized understanding Med list updated.Patient  to follow up with [facility]   ----- Message from Consuelo Pandy, PA-C sent at 03/20/2022  2:43 PM EST ----- K still mildly elevated at 5.2. Scr improved. Instruct patient to take dose of 20 mg PO Lasix today (usually takes PRN) and reduce losartan to 1/2 tablet a day (12.5 mg). Needs repeat BMP and f/u w/ [facility] provider next week (followed by Dr. Gardiner Rhyme)

## 2022-03-23 NOTE — Progress Notes (Unsigned)
Cardiology Office Note:    Date:  03/24/2022   ID:  [name], DOB 24-Nov-1931, MRN [id number]  PCP:  Donnajean Lopes, MD  Cardiologist:  None  Electrophysiologist:  None   Referring MD: Donnajean Lopes, MD   Chief Complaint  Patient presents with   Congestive Heart Failure    History of Present Illness:    [name] is a 88 y.o. female with a hx of CAD, atrial fibrillation, hypertension who presents for follow-up.  She was admitted 02/2022 with dyspnea on exertion and fatigue, found to be in A-fib with RVR with elevated troponin.  Underwent cardiac catheterization which showed occluded proximal to mid LAD, otherwise mild nonobstructive disease.  It was suspected that MI occurred over 2 weeks prior to presentation, so medical management was recommended.  Since discharge from the hospital, she reports that she has been doing okay.  Denies any chest pain, dyspnea, lightheadedness, syncope, lower extremity edema, or palpitations.  Has been weighing herself daily, reports stable weights.   Wt Readings from Last 3 Encounters:  03/24/22 169 lb 12.8 oz (77 kg)  03/11/22 172 lb 3.2 oz (78.1 kg)  03/10/22 173 lb 6.4 oz (78.7 kg)     No past medical history on file.  Past Surgical History:  Procedure Laterality Date   LEFT HEART CATH AND CORONARY ANGIOGRAPHY N/A 02/20/2022   Procedure: LEFT HEART CATH AND CORONARY ANGIOGRAPHY;  Surgeon: Troy Sine, MD;  Location: [facility];  Service: Cardiovascular;  Laterality: N/A;    Current Medications: Current Meds  Medication Sig   amiodarone (PACERONE) 200 MG tablet Take 200 mg by mouth daily.   apixaban (ELIQUIS) 2.5 MG TABS tablet Take 1 tablet (2.5 mg total) by mouth 2 (two) times daily.   aspirin EC 81 MG tablet Take 1 tablet (81 mg total) by mouth daily. Swallow whole.   atorvastatin (LIPITOR) 80 MG tablet Take 1 tablet (80 mg total) by mouth daily.   azaTHIOprine (IMURAN) 50 MG tablet Take 50 mg by mouth  daily.   empagliflozin (JARDIANCE) 10 MG TABS tablet Take 1 tablet (10 mg total) by mouth daily.   furosemide (LASIX) 20 MG tablet Take 1 tablet (20 mg total) by mouth daily as needed for fluid or edema.   losartan (COZAAR) 25 MG tablet Take 0.5 tablets (12.5 mg total) by mouth daily.   metoprolol succinate (TOPROL-XL) 50 MG 24 hr tablet Take 1 tablet (50 mg total) by mouth daily. Take with or immediately following a meal.   Multiple Vitamin (MULTIVITAMIN) tablet Take 1 tablet by mouth daily.   nitroGLYCERIN (NITROSTAT) 0.4 MG SL tablet Place 1 tablet (0.4 mg total) under the tongue every 5 (five) minutes x 3 doses as needed for chest pain.   pantoprazole (PROTONIX) 20 MG tablet Take 1 tablet (20 mg total) by mouth daily.     Allergies:   Bromfenac, Lisinopril, Meloxicam, Tramadol hcl, and Valacyclovir   Social History   Socioeconomic History   Marital status: Widowed    Spouse name: Not on file   Number of children: 7   Years of education: Not on file   Highest education level: High school graduate  Occupational History   Occupation: Retired  Tobacco Use   Smoking status: Former    Years: 3.00    Types: Cigarettes   Smokeless tobacco: Never  Vaping Use   Vaping Use: Never used  Substance and Sexual Activity   Alcohol use: Never  Drug use: Never   Sexual activity: Not on file  Other Topics Concern   Not on file  Social History Narrative   Not on file   Social Determinants of Health   Financial Resource Strain: Low Risk  (02/21/2022)   Overall Financial Resource Strain (CARDIA)    Difficulty of Paying Living Expenses: Not very hard  Food Insecurity: No Food Insecurity (02/21/2022)   Hunger Vital Sign    Worried About Running Out of Food in the Last Year: Never true    Ran Out of Food in the Last Year: Never true  Transportation Needs: No Transportation Needs (02/21/2022)   PRAPARE - Hydrologist (Medical): No    Lack of Transportation  (Non-Medical): No  Physical Activity: Not on file  Stress: Not on file  Social Connections: Not on file     Family History: The patient's family history is not on file.  ROS:   Please see the history of present illness.     All other systems reviewed and are negative.  EKGs/Labs/Other Studies Reviewed:    The following studies were reviewed today:   EKG:  EKG is not ordered today.   Recent Labs: 02/20/2022: ALT 17 02/21/2022: TSH 5.670 02/23/2022: Magnesium 2.3 03/10/2022: Hemoglobin 11.4; Platelets 459 03/11/2022: B Natriuretic Peptide 1,645.8 03/20/2022: BUN 19; Creatinine, Ser 1.38; Potassium 5.2; Sodium 138  Recent Lipid Panel    Component Value Date/Time   CHOL 142 02/20/2022 0027   TRIG 58 02/20/2022 0027   HDL 35 (L) 02/20/2022 0027   CHOLHDL 4.1 02/20/2022 0027   VLDL 12 02/20/2022 0027   LDLCALC 95 02/20/2022 0027    Physical Exam:    VS:  BP 131/60 (BP Location: Left Arm, Patient Position: Sitting, Cuff Size: Large)   Pulse 64   Ht '5\' 4"'$  (1.626 m)   Wt 169 lb 12.8 oz (77 kg)   SpO2 96%   BMI 29.15 kg/m     Wt Readings from Last 3 Encounters:  03/24/22 169 lb 12.8 oz (77 kg)  03/11/22 172 lb 3.2 oz (78.1 kg)  03/10/22 173 lb 6.4 oz (78.7 kg)     GEN: in no acute distress HEENT: Normal NECK: No JVD; No carotid bruits CARDIAC: RRR, 2/6 systolic murmur RESPIRATORY:  Clear to auscultation without rales, wheezing or rhonchi  ABDOMEN: Soft, non-tender, non-distended MUSCULOSKELETAL:  No edema; No deformity  SKIN: Warm and dry NEUROLOGIC:  Alert and oriented x 3 PSYCHIATRIC:  Normal affect   ASSESSMENT:    1. Acute combined systolic and diastolic congestive heart failure ([place])   2. Coronary artery disease involving native coronary artery of native heart without angina pectoris   3. Medication management   4. PAF (paroxysmal atrial fibrillation) ([place])   5. AKI (acute kidney injury) ([place])   6. Hyperlipidemia, unspecified hyperlipidemia type     PLAN:     Atrial fibrillation: Went into A-fib/flutter during hospitalization 02/2022.  She converted to sinus rhythm on amiodarone drip. -Continue metoprolol 25 mg twice daily -Continue Eliquis 2.5 mg twice daily -Continue amiodarone 200 mg daily   CAD: Presented with elevated troponin (2400 > 2268) during admission 02/2022.  LHC 02/20/2022 showed occluded proximal to mid LAD, otherwise mild nonobstructive disease, LVEDP 13.  Suspected that patient's MI occurred over prior 2 weeks, medical management recommended.  She denies any anginal symptoms -Continue aspirin, statin -Continue metoprolol 25 mg twice daily   Acute combined systolic and diastolic heart failure: Echo 02/19/2022 shows  EF 25 to 30%, normal RV function, no significant valvular disease.   -On losartan 12.5 mg daily, Toprol-XL 50 mg daily, Jardiance 10 mg daily.  Was on spironolactone but discontinued due to hyperkalemia.  Previously was also on losartan 25 mg daily but reduced to 12.5 mg daily given hyperkalemia.  Check BMET -On Lasix 20 mg daily as needed if gains more than 3 pounds in 1 day or 5 pounds in 1 week -Check echocardiogram prior to next clinic appointment   AKI: Creatinine 1.70 on 03/10/2022, potassium 5.4.  Most recent labs showed potassium 5.2, creatinine 1.38 03/20/2022.  Check BMET  HLD: LDL 95 on 02/20/22, started on atorvastatin 80 mg daily  RTC in 2 months   Medication Adjustments/Labs and Tests Ordered: Current medicines are reviewed at length with the patient today.  Concerns regarding medicines are outlined above.  Orders Placed This Encounter  Procedures   Basic metabolic panel   Magnesium   ECHOCARDIOGRAM COMPLETE   No orders of the defined types were placed in this encounter.   Patient Instructions  Medication Instructions:  Your physician recommends that you continue on your current medications as directed. Please refer to the Current Medication list given to you today.  *If you need a  refill on your cardiac medications before your next appointment, please call your pharmacy*   Lab Work: BMET, [place] today  If you have labs (blood work) drawn today and your tests are completely normal, you will receive your results only by: [place] (if you have MyChart) OR A paper copy in the mail If you have any lab test that is abnormal or we need to change your treatment, we will call you to review the results.   Testing/Procedures: Your physician has requested that you have an echocardiogram in [place] (prior to appointment with Dr. Gardiner Rhyme). Echocardiography is a painless test that uses sound waves to create images of your heart. It provides your doctor with information about the size and shape of your heart and how well your heart's chambers and valves are working. This procedure takes approximately one hour. There are no restrictions for this procedure. Please do NOT wear cologne, perfume, aftershave, or lotions (deodorant is allowed). Please arrive 15 minutes prior to your appointment time.  Follow-Up: At [facility], you and your health needs are our priority.  As part of our continuing mission to provide you with exceptional heart care, we have created designated Provider Care Teams.  These Care Teams include your primary Cardiologist (physician) and Advanced Practice Providers (APPs -  Physician Assistants and Nurse Practitioners) who all work together to provide you with the care you need, when you need it.  We recommend signing up for the patient portal called "MyChart".  Sign up information is provided on this After Visit Summary.  MyChart is used to connect with patients for Virtual Visits (Telemedicine).  Patients are able to view lab/test results, encounter notes, upcoming appointments, etc.  Non-urgent messages can be sent to your provider as well.   To learn more about what you can do with MyChart, go to [email or web address].    Your next appointment:    As scheduled in February with Dr. Gardiner Rhyme     Signed, Donato Heinz, MD  03/24/2022 5:30 PM    [place]

## 2022-03-24 ENCOUNTER — Ambulatory Visit: Payer: Medicare Other | Attending: Cardiology | Admitting: Cardiology

## 2022-03-24 ENCOUNTER — Encounter: Payer: Self-pay | Admitting: Cardiology

## 2022-03-24 VITALS — BP 131/60 | HR 64 | Ht 64.0 in | Wt 169.8 lb

## 2022-03-24 DIAGNOSIS — I5042 Chronic combined systolic (congestive) and diastolic (congestive) heart failure: Secondary | ICD-10-CM | POA: Diagnosis not present

## 2022-03-24 DIAGNOSIS — N179 Acute kidney failure, unspecified: Secondary | ICD-10-CM

## 2022-03-24 DIAGNOSIS — Z79899 Other long term (current) drug therapy: Secondary | ICD-10-CM | POA: Diagnosis not present

## 2022-03-24 DIAGNOSIS — I5041 Acute combined systolic (congestive) and diastolic (congestive) heart failure: Secondary | ICD-10-CM | POA: Diagnosis not present

## 2022-03-24 DIAGNOSIS — I480 Paroxysmal atrial fibrillation: Secondary | ICD-10-CM | POA: Diagnosis not present

## 2022-03-24 DIAGNOSIS — E785 Hyperlipidemia, unspecified: Secondary | ICD-10-CM

## 2022-03-24 DIAGNOSIS — I2510 Atherosclerotic heart disease of native coronary artery without angina pectoris: Secondary | ICD-10-CM

## 2022-03-24 NOTE — Patient Instructions (Signed)
Medication Instructions:  Your physician recommends that you continue on your current medications as directed. Please refer to the Current Medication list given to you today.  *If you need a refill on your cardiac medications before your next appointment, please call your pharmacy*   Lab Work: BMET, [place] today  If you have labs (blood work) drawn today and your tests are completely normal, you will receive your results only by: [place] (if you have MyChart) OR A paper copy in the mail If you have any lab test that is abnormal or we need to change your treatment, we will call you to review the results.   Testing/Procedures: Your physician has requested that you have an echocardiogram in [place] (prior to appointment with Dr. Gardiner Rhyme). Echocardiography is a painless test that uses sound waves to create images of your heart. It provides your doctor with information about the size and shape of your heart and how well your heart's chambers and valves are working. This procedure takes approximately one hour. There are no restrictions for this procedure. Please do NOT wear cologne, perfume, aftershave, or lotions (deodorant is allowed). Please arrive 15 minutes prior to your appointment time.  Follow-Up: At [facility], you and your health needs are our priority.  As part of our continuing mission to provide you with exceptional heart care, we have created designated Provider Care Teams.  These Care Teams include your primary Cardiologist (physician) and Advanced Practice Providers (APPs -  Physician Assistants and Nurse Practitioners) who all work together to provide you with the care you need, when you need it.  We recommend signing up for the patient portal called "MyChart".  Sign up information is provided on this After Visit Summary.  MyChart is used to connect with patients for Virtual Visits (Telemedicine).  Patients are able to view lab/test results, encounter notes,  upcoming appointments, etc.  Non-urgent messages can be sent to your provider as well.   To learn more about what you can do with MyChart, go to [email or web address].    Your next appointment:   As scheduled in February with Dr. Gardiner Rhyme

## 2022-03-26 LAB — BASIC METABOLIC PANEL
BUN/Creatinine Ratio: 17 (ref 12–28)
BUN: 22 mg/dL (ref 8–27)
CO2: 20 mmol/L (ref 20–29)
Calcium: 9.4 mg/dL (ref 8.7–10.3)
Chloride: 103 mmol/L (ref 96–106)
Creatinine, Ser: 1.33 mg/dL — ABNORMAL HIGH (ref 0.57–1.00)
Glucose: 97 mg/dL (ref 70–99)
Potassium: 4.8 mmol/L (ref 3.5–5.2)
Sodium: 139 mmol/L (ref 134–144)
eGFR: 38 mL/min/{1.73_m2} — ABNORMAL LOW (ref >59)

## 2022-03-26 LAB — MAGNESIUM: Magnesium: 2.2 mg/dL (ref 1.6–2.3)

## 2022-03-27 ENCOUNTER — Encounter: Payer: Self-pay | Admitting: *Deleted

## 2022-04-01 DIAGNOSIS — Z961 Presence of intraocular lens: Secondary | ICD-10-CM | POA: Diagnosis not present

## 2022-04-01 DIAGNOSIS — H3023 Posterior cyclitis, bilateral: Secondary | ICD-10-CM | POA: Diagnosis not present

## 2022-04-01 DIAGNOSIS — H35033 Hypertensive retinopathy, bilateral: Secondary | ICD-10-CM | POA: Diagnosis not present

## 2022-04-24 ENCOUNTER — Other Ambulatory Visit: Payer: Self-pay | Admitting: Internal Medicine

## 2022-04-24 DIAGNOSIS — R911 Solitary pulmonary nodule: Secondary | ICD-10-CM

## 2022-05-26 ENCOUNTER — Other Ambulatory Visit (HOSPITAL_COMMUNITY): Payer: Medicare Other

## 2022-05-27 ENCOUNTER — Ambulatory Visit
Admission: RE | Admit: 2022-05-27 | Discharge: 2022-05-27 | Disposition: A | Discharge status: Home / Self Care | Payer: Medicare Other | Source: Ambulatory Visit | Attending: Internal Medicine | Admitting: Internal Medicine

## 2022-05-27 DIAGNOSIS — R918 Other nonspecific abnormal finding of lung field: Secondary | ICD-10-CM | POA: Diagnosis not present

## 2022-05-27 DIAGNOSIS — I700 Atherosclerosis of aorta: Secondary | ICD-10-CM | POA: Diagnosis not present

## 2022-05-27 DIAGNOSIS — R911 Solitary pulmonary nodule: Secondary | ICD-10-CM | POA: Diagnosis not present

## 2022-05-27 DIAGNOSIS — J439 Emphysema, unspecified: Secondary | ICD-10-CM | POA: Diagnosis not present

## 2022-05-30 ENCOUNTER — Ambulatory Visit (HOSPITAL_COMMUNITY): Payer: Medicare Other | Attending: Cardiology

## 2022-05-30 DIAGNOSIS — I5041 Acute combined systolic (congestive) and diastolic (congestive) heart failure: Secondary | ICD-10-CM | POA: Diagnosis not present

## 2022-05-30 LAB — ECHOCARDIOGRAM COMPLETE
AR max vel: 1.27 cm2
AV Area VTI: 1.27 cm2
AV Area mean vel: 1.22 cm2
AV Mean grad: 11 mmHg
AV Peak grad: 19.4 mmHg
Ao pk vel: 2.2 m/s
Area-P 1/2: 5.2 cm2
S' Lateral: 2.7 cm

## 2022-05-30 MED ORDER — PERFLUTREN LIPID MICROSPHERE
1.0000 mL | INTRAVENOUS | Status: AC | PRN
  Administered 2022-05-30: 2 mL via INTRAVENOUS

## 2022-06-04 ENCOUNTER — Ambulatory Visit: Payer: Medicare Other | Attending: Cardiology | Admitting: Cardiology

## 2022-06-04 ENCOUNTER — Encounter: Payer: Self-pay | Admitting: Cardiology

## 2022-06-04 VITALS — BP 162/90 | HR 84 | Ht 64.0 in | Wt 160.4 lb

## 2022-06-04 DIAGNOSIS — I350 Nonrheumatic aortic (valve) stenosis: Secondary | ICD-10-CM | POA: Insufficient documentation

## 2022-06-04 DIAGNOSIS — I5042 Chronic combined systolic (congestive) and diastolic (congestive) heart failure: Secondary | ICD-10-CM | POA: Diagnosis not present

## 2022-06-04 DIAGNOSIS — I2510 Atherosclerotic heart disease of native coronary artery without angina pectoris: Secondary | ICD-10-CM | POA: Diagnosis not present

## 2022-06-04 DIAGNOSIS — I480 Paroxysmal atrial fibrillation: Secondary | ICD-10-CM | POA: Diagnosis not present

## 2022-06-04 DIAGNOSIS — E785 Hyperlipidemia, unspecified: Secondary | ICD-10-CM | POA: Diagnosis not present

## 2022-06-04 MED ORDER — AMIODARONE HCL 200 MG PO TABS: 200.0000 mg | ORAL_TABLET | Freq: Every day | ORAL | 3 refills | Status: AC

## 2022-06-04 MED ORDER — EMPAGLIFLOZIN 10 MG PO TABS: 10.0000 mg | ORAL_TABLET | Freq: Every day | ORAL | 3 refills | Status: DC

## 2022-06-04 MED ORDER — METOPROLOL SUCCINATE ER 50 MG PO TB24: 50.0000 mg | ORAL_TABLET | Freq: Every day | ORAL | 3 refills | Status: AC

## 2022-06-04 MED ORDER — ATORVASTATIN CALCIUM 80 MG PO TABS: 80.0000 mg | ORAL_TABLET | Freq: Every day | ORAL | 3 refills | Status: AC

## 2022-06-04 MED ORDER — LOSARTAN POTASSIUM 25 MG PO TABS: 12.5000 mg | ORAL_TABLET | Freq: Every day | ORAL | 3 refills | Status: DC

## 2022-06-04 NOTE — Patient Instructions (Addendum)
Medication Instructions:  PLEASE call tomorrow with an updated medication list  RESTART Jardiance 10 mg daily  *If you need a refill on your cardiac medications before your next appointment, please call your pharmacy*   Lab Work: BMET, Lipid today  If you have labs (blood work) drawn today and your tests are completely normal, you will receive your results only by: [place] (if you have MyChart) OR A paper copy in the mail If you have any lab test that is abnormal or we need to change your treatment, we will call you to review the results.  Follow-Up: At [facility], you and your health needs are our priority.  As part of our continuing mission to provide you with exceptional heart care, we have created designated Provider Care Teams.  These Care Teams include your primary Cardiologist (physician) and Advanced Practice Providers (APPs -  Physician Assistants and Nurse Practitioners) who all work together to provide you with the care you need, when you need it.  We recommend signing up for the patient portal called "MyChart".  Sign up information is provided on this After Visit Summary.  MyChart is used to connect with patients for Virtual Visits (Telemedicine).  Patients are able to view lab/test results, encounter notes, upcoming appointments, etc.  Non-urgent messages can be sent to your provider as well.   To learn more about what you can do with MyChart, go to [email or web address].    Your next appointment:   3 month(s)  Provider:   Dr. Gardiner Rhyme

## 2022-06-04 NOTE — Progress Notes (Signed)
Cardiology Office Note:    Date:  06/04/2022   ID:  [name], DOB Jan 19, 1932, MRN [id number]  PCP:  Donnajean Lopes, MD  Cardiologist:  None  Electrophysiologist:  None   Referring MD: Donnajean Lopes, MD   Chief Complaint  Patient presents with   Congestive Heart Failure   History of Present Illness:    [name] is a 89 y.o. female with a hx of CAD, atrial fibrillation, hypertension who presents for follow-up.  She was admitted 02/2022 with dyspnea on exertion and fatigue, found to be in A-fib with RVR with elevated troponin.  Underwent cardiac catheterization which showed occluded proximal to mid LAD, otherwise mild nonobstructive disease.  It was suspected that MI occurred over 2 weeks prior to presentation, so medical management was recommended.  Echocardiogram 05/30/2022 showed EF 40 to 45% with akinesis of the mid/distal anteroseptal and distal inferior walls, moderate basal septal LVH, grade 1 diastolic dysfunction, normal RV function, mild MR, mild to moderate AS.  Since last clinic visit, she reports she is doing okay.  Denies any chest pain or dyspnea.  Denies any lightheadedness, syncope, lower extremity edema, or palpitations.  Has been off losartan and toprol since Saturday.  She is taking Eliquis, denies any bleeding issues.   Wt Readings from Last 3 Encounters:  06/04/22 160 lb 6.4 oz (72.8 kg)  03/24/22 169 lb 12.8 oz (77 kg)  03/11/22 172 lb 3.2 oz (78.1 kg)     No past medical history on file.  Past Surgical History:  Procedure Laterality Date   LEFT HEART CATH AND CORONARY ANGIOGRAPHY N/A 02/20/2022   Procedure: LEFT HEART CATH AND CORONARY ANGIOGRAPHY;  Surgeon: Troy Sine, MD;  Location: [facility];  Service: Cardiovascular;  Laterality: N/A;    Current Medications: Current Meds  Medication Sig   empagliflozin (JARDIANCE) 10 MG TABS tablet Take 1 tablet (10 mg total) by mouth daily before breakfast.     Allergies:    Bromfenac, Lisinopril, Meloxicam, Tramadol hcl, and Valacyclovir   Social History   Socioeconomic History   Marital status: Widowed    Spouse name: Not on file   Number of children: 7   Years of education: Not on file   Highest education level: High school graduate  Occupational History   Occupation: Retired  Tobacco Use   Smoking status: Former    Years: 3.00    Types: Cigarettes   Smokeless tobacco: Never  Vaping Use   Vaping Use: Never used  Substance and Sexual Activity   Alcohol use: Never   Drug use: Never   Sexual activity: Not on file  Other Topics Concern   Not on file  Social History Narrative   Not on file   Social Determinants of Health   Financial Resource Strain: Low Risk  (02/21/2022)   Overall Financial Resource Strain (CARDIA)    Difficulty of Paying Living Expenses: Not very hard  Food Insecurity: No Food Insecurity (02/21/2022)   Hunger Vital Sign    Worried About Running Out of Food in the Last Year: Never true    Ran Out of Food in the Last Year: Never true  Transportation Needs: No Transportation Needs (02/21/2022)   PRAPARE - Hydrologist (Medical): No    Lack of Transportation (Non-Medical): No  Physical Activity: Not on file  Stress: Not on file  Social Connections: Not on file     Family History: The patient's family  history is not on file.  ROS:   Please see the history of present illness.     All other systems reviewed and are negative.  EKGs/Labs/Other Studies Reviewed:    The following studies were reviewed today:   EKG:  EKG is not ordered today.   Recent Labs: 02/20/2022: ALT 17 02/21/2022: TSH 5.670 03/10/2022: Hemoglobin 11.4; Platelets 459 03/11/2022: B Natriuretic Peptide 1,645.8 03/24/2022: BUN 22; Creatinine, Ser 1.33; Magnesium 2.2; Potassium 4.8; Sodium 139  Recent Lipid Panel    Component Value Date/Time   CHOL 142 02/20/2022 0027   TRIG 58 02/20/2022 0027   HDL 35 (L) 02/20/2022 0027    CHOLHDL 4.1 02/20/2022 0027   VLDL 12 02/20/2022 0027   LDLCALC 95 02/20/2022 0027    Physical Exam:    VS:  BP (!) 162/90   Pulse 84   Ht 5' 4"$  (1.626 m)   Wt 160 lb 6.4 oz (72.8 kg)   SpO2 100%   BMI 27.53 kg/m     Wt Readings from Last 3 Encounters:  06/04/22 160 lb 6.4 oz (72.8 kg)  03/24/22 169 lb 12.8 oz (77 kg)  03/11/22 172 lb 3.2 oz (78.1 kg)     GEN: in no acute distress HEENT: Normal NECK: No JVD; No carotid bruits CARDIAC: RRR, 2/6 systolic murmur RESPIRATORY:  Clear to auscultation without rales, wheezing or rhonchi  ABDOMEN: Soft, non-tender, non-distended MUSCULOSKELETAL:  No edema; No deformity  SKIN: Warm and dry NEUROLOGIC:  Alert and oriented x 3 PSYCHIATRIC:  Normal affect   ASSESSMENT:    1. PAF (paroxysmal atrial fibrillation) ([place])   2. Coronary artery disease involving native coronary artery of native heart without angina pectoris   3. Chronic combined systolic and diastolic congestive heart failure ([place])   4. Hyperlipidemia, unspecified hyperlipidemia type   5. Aortic valve stenosis, etiology of cardiac valve disease unspecified    PLAN:    Atrial fibrillation: Went into A-fib/flutter during hospitalization 02/2022.  She converted to sinus rhythm on amiodarone drip. -Continue metoprolol 25 mg twice daily -Continue Eliquis 2.5 mg twice daily.  Check BMET, may need to increase Eliquis dose given improvement in renal function -Continue amiodarone 200 mg daily   CAD: Presented with elevated troponin (2400 > 2268) during admission 02/2022.  LHC 02/20/2022 showed occluded proximal to mid LAD, otherwise mild nonobstructive disease, LVEDP 13.  Suspected that patient's MI occurred over prior 2 weeks, medical management recommended.  She denies any anginal symptoms -Continue aspirin, statin -Continue toprol XL 50 mg daily   Acute combined systolic and diastolic heart failure: Echo 02/19/2022 shows EF 25 to 30%, normal RV function, no significant  valvular disease.  Echocardiogram 05/30/2022 showed EF 40 to 45% with akinesis of the mid/distal anteroseptal and distal inferior walls, moderate basal septal LVH, grade 1 diastolic dysfunction, normal RV function, mild MR, mild to moderate AS. -On losartan 12.5 mg daily, Toprol-XL 50 mg daily, Jardiance 10 mg daily.  Was on spironolactone but discontinued due to hyperkalemia.  Previously was also on losartan 25 mg daily but reduced to 12.5 mg daily given hyperkalemia.  Check BMET.  She reports she ran out of her meds last week, will refill -On Lasix 20 mg daily as needed if gains more than 3 pounds in 1 day or 5 pounds in 1 week  Hypertension: BP elevated in clinic today but reports he has been off her meds since last week, will refill losartan and metoprolol  Aortic stenosis: Mild to moderate on  echocardiogram 05/30/2022.  Plan repeat echocardiogram in 1 year to monitor   AKI: Creatinine 1.70 on 03/10/2022, potassium 5.4.  Most recent labs 03/24/2022 showed creatinine 1.33, potassium 4.8  HLD: LDL 95 on 02/20/22, started on atorvastatin 80 mg daily.  Check lipid panel  RTC in 3 months   Medication Adjustments/Labs and Tests Ordered: Current medicines are reviewed at length with the patient today.  Concerns regarding medicines are outlined above.  Orders Placed This Encounter  Procedures   Basic metabolic panel   Lipid panel   Meds ordered this encounter  Medications   losartan (COZAAR) 25 MG tablet    Sig: Take 0.5 tablets (12.5 mg total) by mouth daily.    Dispense:  45 tablet    Refill:  3   metoprolol succinate (TOPROL-XL) 50 MG 24 hr tablet    Sig: Take 1 tablet (50 mg total) by mouth daily. Take with or immediately following a meal.    Dispense:  90 tablet    Refill:  3   empagliflozin (JARDIANCE) 10 MG TABS tablet    Sig: Take 1 tablet (10 mg total) by mouth daily before breakfast.    Dispense:  90 tablet    Refill:  3   atorvastatin (LIPITOR) 80 MG tablet    Sig: Take 1 tablet  (80 mg total) by mouth daily.    Dispense:  90 tablet    Refill:  3    Patient Instructions  Medication Instructions:  PLEASE call tomorrow with an updated medication list  RESTART Jardiance 10 mg daily  *If you need a refill on your cardiac medications before your next appointment, please call your pharmacy*   Lab Work: BMET, Lipid today  If you have labs (blood work) drawn today and your tests are completely normal, you will receive your results only by: [place] (if you have MyChart) OR A paper copy in the mail If you have any lab test that is abnormal or we need to change your treatment, we will call you to review the results.  Follow-Up: At [facility], you and your health needs are our priority.  As part of our continuing mission to provide you with exceptional heart care, we have created designated Provider Care Teams.  These Care Teams include your primary Cardiologist (physician) and Advanced Practice Providers (APPs -  Physician Assistants and Nurse Practitioners) who all work together to provide you with the care you need, when you need it.  We recommend signing up for the patient portal called "MyChart".  Sign up information is provided on this After Visit Summary.  MyChart is used to connect with patients for Virtual Visits (Telemedicine).  Patients are able to view lab/test results, encounter notes, upcoming appointments, etc.  Non-urgent messages can be sent to your provider as well.   To learn more about what you can do with MyChart, go to [email or web address].    Your next appointment:   4 month(s)  Provider:   Dr. Gardiner Rhyme     Signed, Donato Heinz, MD  06/04/2022 4:29 PM    [place]

## 2022-06-05 LAB — LIPID PANEL
Chol/HDL Ratio: 3.3 ratio (ref 0.0–4.4)
Cholesterol, Total: 180 mg/dL (ref 100–199)
HDL: 55 mg/dL (ref >39)
LDL Chol Calc (NIH): 110 mg/dL — ABNORMAL HIGH (ref 0–99)
Triglycerides: 80 mg/dL (ref 0–149)
VLDL Cholesterol Cal: 15 mg/dL (ref 5–40)

## 2022-06-05 LAB — BASIC METABOLIC PANEL
BUN/Creatinine Ratio: 18 (ref 12–28)
BUN: 20 mg/dL (ref 8–27)
CO2: 22 mmol/L (ref 20–29)
Calcium: 9.9 mg/dL (ref 8.7–10.3)
Chloride: 103 mmol/L (ref 96–106)
Creatinine, Ser: 1.13 mg/dL — ABNORMAL HIGH (ref 0.57–1.00)
Glucose: 99 mg/dL (ref 70–99)
Potassium: 5 mmol/L (ref 3.5–5.2)
Sodium: 141 mmol/L (ref 134–144)
eGFR: 47 mL/min/{1.73_m2} — ABNORMAL LOW (ref >59)

## 2022-06-06 ENCOUNTER — Other Ambulatory Visit: Payer: Self-pay | Admitting: *Deleted

## 2022-06-06 DIAGNOSIS — I5042 Chronic combined systolic (congestive) and diastolic (congestive) heart failure: Secondary | ICD-10-CM

## 2022-06-06 DIAGNOSIS — Z79899 Other long term (current) drug therapy: Secondary | ICD-10-CM

## 2022-06-06 DIAGNOSIS — E875 Hyperkalemia: Secondary | ICD-10-CM

## 2022-06-07 ENCOUNTER — Other Ambulatory Visit (HOSPITAL_COMMUNITY): Payer: Self-pay | Admitting: Cardiology

## 2022-06-09 ENCOUNTER — Other Ambulatory Visit (HOSPITAL_COMMUNITY): Payer: Self-pay

## 2022-06-09 ENCOUNTER — Other Ambulatory Visit: Payer: Self-pay | Admitting: *Deleted

## 2022-06-09 ENCOUNTER — Telehealth: Payer: Self-pay

## 2022-06-09 DIAGNOSIS — E785 Hyperlipidemia, unspecified: Secondary | ICD-10-CM

## 2022-06-09 DIAGNOSIS — Z79899 Other long term (current) drug therapy: Secondary | ICD-10-CM

## 2022-06-09 NOTE — Telephone Encounter (Signed)
Per test claim she would need a PA to determine what her cost for Wilder Glade would be.

## 2022-06-09 NOTE — Telephone Encounter (Signed)
-----   Message from Rockne Menghini, [place] sent at 06/09/2022 12:18 PM EST ----- Regarding: test claim Hi ladies  Would one of you run a test claim on this lady for [place].  She was prescribed Jardiance and got a 3 month supply for $500.  She has [employer] and I've neve seen them charge that much.  Was wondering if maybe it was a formulary issue.  Thanks

## 2022-06-12 ENCOUNTER — Telehealth: Payer: Self-pay | Admitting: Cardiology

## 2022-06-12 ENCOUNTER — Other Ambulatory Visit: Payer: Self-pay

## 2022-06-12 DIAGNOSIS — I5042 Chronic combined systolic (congestive) and diastolic (congestive) heart failure: Secondary | ICD-10-CM | POA: Diagnosis not present

## 2022-06-12 DIAGNOSIS — Z79899 Other long term (current) drug therapy: Secondary | ICD-10-CM | POA: Diagnosis not present

## 2022-06-12 LAB — BASIC METABOLIC PANEL
BUN/Creatinine Ratio: 19 (ref 12–28)
BUN: 24 mg/dL (ref 8–27)
CO2: 26 mmol/L (ref 20–29)
Calcium: 10.3 mg/dL (ref 8.7–10.3)
Chloride: 102 mmol/L (ref 96–106)
Creatinine, Ser: 1.25 mg/dL — ABNORMAL HIGH (ref 0.57–1.00)
Glucose: 85 mg/dL (ref 70–99)
Potassium: 5.3 mmol/L — ABNORMAL HIGH (ref 3.5–5.2)
Sodium: 139 mmol/L (ref 134–144)
eGFR: 41 mL/min/{1.73_m2} — ABNORMAL LOW (ref >59)

## 2022-06-12 NOTE — Telephone Encounter (Signed)
Returned call to patient who states that her [name] for 1 month supply was 516$- patient would like to know if there is an alternative Wilder Glade) that may be more cost effective or patient states if not then she would rather stop taking all together. Patient states that she already takes a lot of other medications and is just can not afford to pay for this. Will forward to PharmD to advise, as someone was previously working on [employer] for [employer].

## 2022-06-12 NOTE — Telephone Encounter (Signed)
Pt c/o medication issue:  1. Name of Medication:   empagliflozin (JARDIANCE) 10 MG TABS tablet   2. How are you currently taking this medication (dosage and times per day)? As prescribed  3. Are you having a reaction (difficulty breathing--STAT)?   No  4. What is your medication issue?   Patient stated she wants to stop taking this medication and would like a call back on next steps.

## 2022-06-13 NOTE — Addendum Note (Signed)
Addended by: Patria Mane A on: 06/13/2022 05:17 PM   Modules accepted: Orders

## 2022-06-13 NOTE — Telephone Encounter (Signed)
Will complete PA to find copay for Farxiga

## 2022-06-17 ENCOUNTER — Other Ambulatory Visit (HOSPITAL_COMMUNITY): Payer: Self-pay

## 2022-06-17 ENCOUNTER — Telehealth: Payer: Self-pay

## 2022-06-17 NOTE — Telephone Encounter (Signed)
Pharmacy Patient Advocate Encounter  Prior Authorization for FARXIGA 10 MG  has been approved.    Per test claim cost is about $76 for a 1 month supply. Cost may vary across pharmacies.   PA# [id number] Effective dates: 05/18/22 through 12/31   Received notification from [employer] that prior authorization for FARXIGA 10 MG  is needed.    PA submitted on 06/17/22 Key [id number] Status is pending  Karie Soda, [place] Patient Advocate Specialist Direct Number: [phone] Fax: [phone]

## 2022-06-19 ENCOUNTER — Other Ambulatory Visit: Payer: Self-pay

## 2022-06-19 ENCOUNTER — Other Ambulatory Visit: Payer: Self-pay | Admitting: *Deleted

## 2022-06-19 ENCOUNTER — Other Ambulatory Visit (HOSPITAL_COMMUNITY)
Admission: RE | Admit: 2022-06-19 | Discharge: 2022-06-19 | Disposition: A | Discharge status: Home / Self Care | Payer: Medicare Other | Source: Other Acute Inpatient Hospital

## 2022-06-19 DIAGNOSIS — Z79899 Other long term (current) drug therapy: Secondary | ICD-10-CM

## 2022-06-19 DIAGNOSIS — I5042 Chronic combined systolic (congestive) and diastolic (congestive) heart failure: Secondary | ICD-10-CM

## 2022-06-19 NOTE — Telephone Encounter (Signed)
Wilder Glade price seems lower than Jardiance

## 2022-06-20 LAB — BASIC METABOLIC PANEL
BUN/Creatinine Ratio: 21 (ref 12–28)
BUN: 26 mg/dL (ref 8–27)
CO2: 24 mmol/L (ref 20–29)
Calcium: 9.7 mg/dL (ref 8.7–10.3)
Chloride: 103 mmol/L (ref 96–106)
Creatinine, Ser: 1.21 mg/dL — ABNORMAL HIGH (ref 0.57–1.00)
Glucose: 85 mg/dL (ref 70–99)
Potassium: 4.9 mmol/L (ref 3.5–5.2)
Sodium: 142 mmol/L (ref 134–144)
eGFR: 43 mL/min/{1.73_m2} — ABNORMAL LOW (ref >59)

## 2022-06-20 NOTE — Telephone Encounter (Signed)
Yes that is fine to switch to [place]

## 2022-06-25 ENCOUNTER — Other Ambulatory Visit: Payer: Self-pay | Admitting: Internal Medicine

## 2022-06-25 DIAGNOSIS — E041 Nontoxic single thyroid nodule: Secondary | ICD-10-CM

## 2022-06-27 MED ORDER — DAPAGLIFLOZIN PROPANEDIOL 10 MG PO TABS: 10.0000 mg | ORAL_TABLET | Freq: Every day | ORAL | 3 refills | Status: DC

## 2022-06-27 NOTE — Telephone Encounter (Signed)
Patient aware and verbalized understanding.  Rx sent to pharmacy.  Advised to keep pharmD appt on 4/4.

## 2022-06-27 NOTE — Addendum Note (Signed)
Addended by: Patria Mane A on: 06/27/2022 10:48 AM   Modules accepted: Orders

## 2022-07-08 DIAGNOSIS — Z961 Presence of intraocular lens: Secondary | ICD-10-CM | POA: Diagnosis not present

## 2022-07-08 DIAGNOSIS — H35033 Hypertensive retinopathy, bilateral: Secondary | ICD-10-CM | POA: Diagnosis not present

## 2022-07-08 DIAGNOSIS — H3023 Posterior cyclitis, bilateral: Secondary | ICD-10-CM | POA: Diagnosis not present

## 2022-07-24 ENCOUNTER — Ambulatory Visit: Payer: Medicare Other | Attending: Cardiology | Admitting: Pharmacist Clinician (PhC)/ Clinical Pharmacy Specialist

## 2022-07-24 ENCOUNTER — Ambulatory Visit
Admission: RE | Admit: 2022-07-24 | Discharge: 2022-07-24 | Disposition: A | Discharge status: Home / Self Care | Payer: Medicare Other | Source: Ambulatory Visit | Attending: Internal Medicine | Admitting: Internal Medicine

## 2022-07-24 ENCOUNTER — Encounter: Payer: Self-pay | Admitting: Pharmacist Clinician (PhC)/ Clinical Pharmacy Specialist

## 2022-07-24 VITALS — Ht 64.0 in | Wt 158.0 lb

## 2022-07-24 DIAGNOSIS — I5041 Acute combined systolic (congestive) and diastolic (congestive) heart failure: Secondary | ICD-10-CM | POA: Insufficient documentation

## 2022-07-24 DIAGNOSIS — E041 Nontoxic single thyroid nodule: Secondary | ICD-10-CM | POA: Diagnosis not present

## 2022-07-24 MED ORDER — APIXABAN 5 MG PO TABS: 5.0000 mg | ORAL_TABLET | Freq: Two times a day (BID) | ORAL | 1 refills | Status: AC

## 2022-07-24 NOTE — Assessment & Plan Note (Signed)
Assessment:  Patient looking to discontinue some of her medications at this time.  Not interested in starting anything else.   Reviewed all cardiac medications, one by one and discussed benefits and risks realted to taking/stopping each med.   Denies SOB, palpitation, chest pain, headaches,or edema No concerns with regards to cost of medications, compliance, ability to pick up at pharmacy  Plan: GDMT ACEI/ARB/ARNI Losartan 12.5 mg Pt wishes to discontinue  Beta blocker Metoprolol succ 50 mg Will stay on, explained benefit to AF as well as cardiovascular/CHF benefits  MRA spironolactone Already discontinued  SGLT2 Farxiga 10 mg Patient is not interested in starting.  (Will take remainder of original jardiance rx then stop)   Labs orderd:  none Also was given pantoprazole for potential GERD, will have her take every other day for 10-14 days then stop.  States only GERD issue was couple of weeks prior to admit - thinks it may have been the MI Follow up with Dr. Gardiner Rhyme in May

## 2022-07-24 NOTE — Patient Instructions (Addendum)
Increase Eliquis to 5 mg twice daily when your 2.5 mg prescription runs out.  Switch pantoprazole to every other day for about 10-14 days, then stop.  Stop losartan once your current bottle runs out.   If you have any further questions or concerns please reach out to [place].  [place]

## 2022-07-24 NOTE — Progress Notes (Signed)
Office Visit    Patient Name: [name] Date of Encounter: 07/24/2022  Primary Care Provider:  Default, Provider, MD Primary Cardiologist:  None  Chief Complaint    Hyperlipidemia   Significant Past Medical History   CAD 11/23 heart cath - MI occurred ~2wks prior to presentation; 100% stenosis of LAD (prox- mid and mid-idistal)  AF   HTN            Allergies  Allergen Reactions   Bromfenac Other (See Comments)    Hives, welts   Lisinopril     Other Reaction(s): arthralgias   Meloxicam Other (See Comments)    unknown   Tramadol Hcl     Other Reaction(s): ineffective   Valacyclovir     GI Upset    History of Present Illness    [name] is a 89 y.o. female patient of Dr Gardiner Rhyme, in the office today to discuss options for cholesterol management.   She was admitted to [facility] last November and found to have had an MI about 2 weeks prior to hospitalization.  She presented with ongoing dyspnea, decreasing energy and lightheadedness.  She was found to be in AF with RVR and had elevated troponins.  Heart cath showed blocked LAD with right coronary collaterals to LAD.  On discharge amlodipine and olmesartan were stopped and she was started on 8 other medications.   Today she is in the office to discuss options for cholesterol management, however she is not interested in taking any more medications, and wants to get off a few that she currently takes.  Today she notes side effects of dry mouth, frequent urination and weight loss (about 20 pounds since November).  She has also had 2 nose bleeds in the past few weeks, though admits that neither was serious.    Insurance Carrier: [employer]  LDL Cholesterol goal:  LDL < 70  Current Medications:   atorvastatin 80 mg qd    Accessory Clinical Findings   Lab Results  Component Value Date   CHOL 180 06/04/2022   HDL 55 06/04/2022   LDLCALC 110 (H) 06/04/2022   TRIG 80 06/04/2022   CHOLHDL 3.3 06/04/2022     Lab Results  Component Value Date   ALT 17 02/20/2022   AST 30 02/20/2022   ALKPHOS 60 02/20/2022   BILITOT 0.9 02/20/2022   Lab Results  Component Value Date   CREATININE 1.21 (H) 06/19/2022   BUN 26 06/19/2022   NA 142 06/19/2022   K 4.9 06/19/2022   CL 103 06/19/2022   CO2 24 06/19/2022   No results found for: "HGBA1C"  Home Medications    Current Outpatient Medications  Medication Sig Dispense Refill   apixaban (ELIQUIS) 5 MG TABS tablet Take 1 tablet (5 mg total) by mouth 2 (two) times daily. 180 tablet 1   amiodarone (PACERONE) 200 MG tablet Take 1 tablet (200 mg total) by mouth daily. 90 tablet 3   aspirin EC 81 MG tablet Take 1 tablet (81 mg total) by mouth daily. Swallow whole. 30 tablet 12   atorvastatin (LIPITOR) 80 MG tablet Take 1 tablet (80 mg total) by mouth daily. 90 tablet 3   azaTHIOprine (IMURAN) 50 MG tablet Take 50 mg by mouth daily.     furosemide (LASIX) 20 MG tablet Take 1 tablet (20 mg total) by mouth daily as needed for fluid or edema. (Patient not taking: Reported on 07/24/2022) 30 tablet 11   metoprolol succinate (TOPROL-XL) 50 MG  24 hr tablet Take 1 tablet (50 mg total) by mouth daily. Take with or immediately following a meal. 90 tablet 3   Multiple Vitamin (MULTIVITAMIN) tablet Take 1 tablet by mouth daily.     nitroGLYCERIN (NITROSTAT) 0.4 MG SL tablet Place 1 tablet (0.4 mg total) under the tongue every 5 (five) minutes x 3 doses as needed for chest pain. 30 tablet 1   No current facility-administered medications for this visit.     Assessment & Plan    Acute combined systolic and diastolic heart failure (HCC) Assessment:  Patient looking to discontinue some of her medications at this time.  Not interested in starting anything else.   Reviewed all cardiac medications, one by one and discussed benefits and risks realted to taking/stopping each med.   Denies SOB, palpitation, chest pain, headaches,or edema No concerns with regards to cost of  medications, compliance, ability to pick up at pharmacy  Plan: GDMT ACEI/ARB/ARNI Losartan 12.5 mg Pt wishes to discontinue  Beta blocker Metoprolol succ 50 mg Will stay on, explained benefit to AF as well as cardiovascular/CHF benefits  MRA spironolactone Already discontinued  SGLT2 Farxiga 10 mg Patient is not interested in starting.  (Will take remainder of original jardiance rx then stop)   Labs orderd:  none Also was given pantoprazole for potential GERD, will have her take every other day for 10-14 days then stop.  States only GERD issue was couple of weeks prior to admit - thinks it may have been the MI Follow up with Dr. Gardiner Rhyme in May    Tommy Medal, PharmD CPP [facility] [address] [place]  [place], [place] [address] [phone]  07/24/2022, 12:54 PM

## 2022-08-29 ENCOUNTER — Other Ambulatory Visit: Payer: Self-pay | Admitting: Internal Medicine

## 2022-08-29 DIAGNOSIS — E041 Nontoxic single thyroid nodule: Secondary | ICD-10-CM

## 2022-09-11 ENCOUNTER — Ambulatory Visit: Payer: Medicare Other | Attending: Cardiology | Admitting: Cardiology

## 2022-09-11 ENCOUNTER — Encounter: Payer: Self-pay | Admitting: Cardiology

## 2022-09-11 VITALS — BP 136/86 | HR 69 | Ht 64.0 in | Wt 152.8 lb

## 2022-09-11 DIAGNOSIS — I2510 Atherosclerotic heart disease of native coronary artery without angina pectoris: Secondary | ICD-10-CM | POA: Insufficient documentation

## 2022-09-11 DIAGNOSIS — I480 Paroxysmal atrial fibrillation: Secondary | ICD-10-CM | POA: Diagnosis not present

## 2022-09-11 DIAGNOSIS — I10 Essential (primary) hypertension: Secondary | ICD-10-CM | POA: Diagnosis not present

## 2022-09-11 DIAGNOSIS — E785 Hyperlipidemia, unspecified: Secondary | ICD-10-CM | POA: Insufficient documentation

## 2022-09-11 DIAGNOSIS — I5042 Chronic combined systolic (congestive) and diastolic (congestive) heart failure: Secondary | ICD-10-CM

## 2022-09-11 DIAGNOSIS — R040 Epistaxis: Secondary | ICD-10-CM | POA: Diagnosis not present

## 2022-09-11 MED ORDER — LOSARTAN POTASSIUM 25 MG PO TABS: 12.5000 mg | ORAL_TABLET | Freq: Every day | ORAL | 3 refills | Status: AC

## 2022-09-11 NOTE — Patient Instructions (Addendum)
Medication Instructions:  STOP aspirin RESTART losartan 12.5 mg daily  *If you need a refill on your cardiac medications before your next appointment, please call your pharmacy*  Lab Work: Please return for FASTING labs in 1 week (BMET, CBC, Lipid)  Our in office lab hours are Monday-Friday 8:00-4:00, closed for lunch 1-2 pm.  No appointment needed.  [facility] locations:   [facility] - [address]  - [address] ([facility]) - [address] [phone] [address]   [place] - [address]    [place]  - [address]    [place] - [address] - [address] [employer]  - [address] - [address] ([facility])  Follow-Up: At [facility], you and your health needs are our priority.  As part of our continuing mission to provide you with exceptional heart care, we have created designated Provider Care Teams.  These Care Teams include your primary Cardiologist (physician) and Advanced Practice Providers (APPs -  Physician Assistants and Nurse Practitioners) who all work together to provide you with the care you need, when you need it.  We recommend signing up for the patient portal called "MyChart".  Sign up information is provided on this After Visit Summary.  MyChart is used to connect with patients for Virtual Visits (Telemedicine).  Patients are able to view lab/test results, encounter notes, upcoming appointments, etc.  Non-urgent messages can be sent to your provider as well.   To learn more about what you can do with MyChart, go to [email or web address].    Your next appointment:   4 month(s)  Provider:   Dr. Bjorn Pippin

## 2022-09-11 NOTE — Progress Notes (Signed)
Cardiology Office Note:    Date:  09/11/2022   ID:  [name], DOB December 07, 1931, MRN [id number]  PCP:  Default, Provider, MD  Cardiologist:  Little Ishikawa, MD  Electrophysiologist:  None   Referring MD: Garlan Fillers, MD   Chief Complaint  Patient presents with   Coronary Artery Disease   History of Present Illness:    [name] is a 89 y.o. female with a hx of CAD, atrial fibrillation, hypertension who presents for follow-up.  She was admitted 02/2022 with dyspnea on exertion and fatigue, found to be in A-fib with RVR with elevated troponin.  Underwent cardiac catheterization which showed occluded proximal to mid LAD, otherwise mild nonobstructive disease.  It was suspected that MI occurred over 2 weeks prior to presentation, so medical management was recommended.  Echocardiogram 05/30/2022 showed EF 40 to 45% with akinesis of the mid/distal anteroseptal and distal inferior walls, moderate basal septal LVH, grade 1 diastolic dysfunction, normal RV function, mild MR, mild to moderate AS.  Since last clinic visit, she reports that she is doing okay.  Denies any chest pain, dyspnea, lightheadedness, syncope, lower extremity edema, or palpitations.  Having issues with nosebleeds recently.   Wt Readings from Last 3 Encounters:  09/11/22 152 lb 12.8 oz (69.3 kg)  07/24/22 158 lb (71.7 kg)  06/04/22 160 lb 6.4 oz (72.8 kg)     No past medical history on file.  Past Surgical History:  Procedure Laterality Date   LEFT HEART CATH AND CORONARY ANGIOGRAPHY N/A 02/20/2022   Procedure: LEFT HEART CATH AND CORONARY ANGIOGRAPHY;  Surgeon: Lennette Bihari, MD;  Location: [facility];  Service: Cardiovascular;  Laterality: N/A;    Current Medications: Current Meds  Medication Sig   amiodarone (PACERONE) 200 MG tablet Take 1 tablet (200 mg total) by mouth daily.   apixaban (ELIQUIS) 5 MG TABS tablet Take 1 tablet (5 mg total) by mouth 2 (two) times daily.    atorvastatin (LIPITOR) 80 MG tablet Take 1 tablet (80 mg total) by mouth daily.   azaTHIOprine (IMURAN) 50 MG tablet Take 50 mg by mouth daily.   losartan (COZAAR) 25 MG tablet Take 0.5 tablets (12.5 mg total) by mouth daily.   metoprolol succinate (TOPROL-XL) 50 MG 24 hr tablet Take 1 tablet (50 mg total) by mouth daily. Take with or immediately following a meal.   Multiple Vitamin (MULTIVITAMIN) tablet Take 1 tablet by mouth daily.   [DISCONTINUED] aspirin EC 81 MG tablet Take 1 tablet (81 mg total) by mouth daily. Swallow whole.     Allergies:   Bromfenac, Lisinopril, Meloxicam, Tramadol hcl, and Valacyclovir   Social History   Socioeconomic History   Marital status: Widowed    Spouse name: Not on file   Number of children: 7   Years of education: Not on file   Highest education level: High school graduate  Occupational History   Occupation: Retired  Tobacco Use   Smoking status: Former    Years: 3    Types: Cigarettes   Smokeless tobacco: Never  Building services engineer Use: Never used  Substance and Sexual Activity   Alcohol use: Never   Drug use: Never   Sexual activity: Not on file  Other Topics Concern   Not on file  Social History Narrative   Not on file   Social Determinants of Health   Financial Resource Strain: Low Risk  (02/21/2022)   Overall Financial Resource Strain (CARDIA)  Difficulty of Paying Living Expenses: Not very hard  Food Insecurity: No Food Insecurity (02/21/2022)   Hunger Vital Sign    Worried About Running Out of Food in the Last Year: Never true    Ran Out of Food in the Last Year: Never true  Transportation Needs: No Transportation Needs (02/21/2022)   PRAPARE - Administrator, Civil Service (Medical): No    Lack of Transportation (Non-Medical): No  Physical Activity: Not on file  Stress: Not on file  Social Connections: Not on file     Family History: The patient's family history is not on file.  ROS:   Please see the  history of present illness.     All other systems reviewed and are negative.  EKGs/Labs/Other Studies Reviewed:    The following studies were reviewed today:   EKG:   09/08/22: Normal sinus rhythm, rate 69, LVH with repolarization abnormalities  Recent Labs: 02/20/2022: ALT 17 02/21/2022: TSH 5.670 03/10/2022: Hemoglobin 11.4; Platelets 459 03/11/2022: B Natriuretic Peptide 1,645.8 03/24/2022: Magnesium 2.2 06/19/2022: BUN 26; Creatinine, Ser 1.21; Potassium 4.9; Sodium 142  Recent Lipid Panel    Component Value Date/Time   CHOL 180 06/04/2022 1635   TRIG 80 06/04/2022 1635   HDL 55 06/04/2022 1635   CHOLHDL 3.3 06/04/2022 1635   CHOLHDL 4.1 02/20/2022 0027   VLDL 12 02/20/2022 0027   LDLCALC 110 (H) 06/04/2022 1635    Physical Exam:    VS:  BP 136/86   Pulse 69   Ht 5\' 4"  (1.626 m)   Wt 152 lb 12.8 oz (69.3 kg)   SpO2 93%   BMI 26.23 kg/m     Wt Readings from Last 3 Encounters:  09/11/22 152 lb 12.8 oz (69.3 kg)  07/24/22 158 lb (71.7 kg)  06/04/22 160 lb 6.4 oz (72.8 kg)     GEN: in no acute distress HEENT: Normal NECK: No JVD; No carotid bruits CARDIAC: RRR, 2/6 systolic murmur RESPIRATORY:  Clear to auscultation without rales, wheezing or rhonchi  ABDOMEN: Soft, non-tender, non-distended MUSCULOSKELETAL:  No edema; No deformity  SKIN: Warm and dry NEUROLOGIC:  Alert and oriented x 3 PSYCHIATRIC:  Normal affect   ASSESSMENT:    1. PAF (paroxysmal atrial fibrillation) (HCC)   2. Coronary artery disease involving native coronary artery of native heart without angina pectoris   3. Chronic combined systolic and diastolic congestive heart failure (HCC)   4. Essential hypertension   5. Hyperlipidemia, unspecified hyperlipidemia type   6. Epistaxis     PLAN:    Atrial fibrillation: Went into A-fib/flutter during hospitalization 02/2022.  She converted to sinus rhythm on amiodarone drip. -Continue toprol XL 50 mg daily -Continue Eliquis 5 mg twice  daily -Continue amiodarone 200 mg daily   CAD: Presented with elevated troponin (2400 > 2268) during admission 02/2022.  LHC 02/20/2022 showed occluded proximal to mid LAD, otherwise mild nonobstructive disease, LVEDP 13.  Suspected that patient's MI occurred over prior 2 weeks, medical management recommended.  She denies any anginal symptoms -Continue Eliquis.  Will stop aspirin as patient has been having issues with nosebleeds -Continue statin -Continue toprol XL 50 mg daily   Chronic combined systolic and diastolic heart failure: Echo 02/19/2022 shows EF 25 to 30%, normal RV function, no significant valvular disease.  Echocardiogram 05/30/2022 showed EF 40 to 45% with akinesis of the mid/distal anteroseptal and distal inferior walls, moderate basal septal LVH, grade 1 diastolic dysfunction, normal RV function, mild MR, mild to moderate  AS. -On Toprol-XL 50 mg daily.  She stopped taking losartan, spironolactone, and Jardiance.  Discussed restarting losartan, she is willing to restart losartan 12.5 mg daily -On Lasix 20 mg daily as needed if gains more than 3 pounds in 1 day or 5 pounds in 1 week.  Appears euvolemic, has not needed to use  Hypertension: On Toprol-XL 50 mg daily.  Add losartan as above  Aortic stenosis: Mild to moderate on echocardiogram 05/30/2022.  Plan repeat echocardiogram in 1 year to monitor   AKI: Creatinine 1.70 on 03/10/2022.  Most recent labs 06/19/2022 showed creatinine improved to 1.21  HLD: LDL 95 on 02/20/22, started on atorvastatin 80 mg daily.  LDL 110 on 06/04/2022, suspect noncompliance.  Will check lipid panel  Epistaxis: Reports issues with nosebleeds recently.  She is on aspirin plus Eliquis, will discontinue aspirin.  Discussed ENT referral which she declines at this time  RTC in 4 months   Medication Adjustments/Labs and Tests Ordered: Current medicines are reviewed at length with the patient today.  Concerns regarding medicines are outlined above.  Orders  Placed This Encounter  Procedures   Basic metabolic panel   CBC   Lipid panel   EKG 12-Lead   Meds ordered this encounter  Medications   losartan (COZAAR) 25 MG tablet    Sig: Take 0.5 tablets (12.5 mg total) by mouth daily.    Dispense:  45 tablet    Refill:  3    Patient Instructions  Medication Instructions:  STOP aspirin RESTART losartan 12.5 mg daily  *If you need a refill on your cardiac medications before your next appointment, please call your pharmacy*  Lab Work: Please return for FASTING labs in 1 week (BMET, CBC, Lipid)  Our in office lab hours are Monday-Friday 8:00-4:00, closed for lunch 1-2 pm.  No appointment needed.  [facility] locations:   [facility] - [address]  - [address] ([facility]) - [address] [phone] [address]   [place] - [address]    [place]  - [address]    [place] - [address] - [address] [employer]  - [address] [place] - [address] ([facility])  Follow-Up: At [facility], you and your health needs are our priority.  As part of our continuing mission to provide you with exceptional heart care, we have created designated Provider Care Teams.  These Care Teams include your primary Cardiologist (physician) and Advanced Practice Providers (APPs -  Physician Assistants and Nurse Practitioners) who all work together to provide you with the care you need, when you need it.  We recommend signing up for the patient portal called "MyChart".  Sign up information is provided on this After Visit Summary.  MyChart is used to connect with patients for Virtual Visits (Telemedicine).  Patients are able to view lab/test results, encounter notes, upcoming appointments, etc.  Non-urgent messages can be sent to your provider as well.   To learn more about what you can do with MyChart,  go to [email or web address].    Your next appointment:   4 month(s)  Provider:   Dr. Bjorn Pippin    Signed, Little Ishikawa, MD  09/11/2022 12:55 PM    [facility]

## 2022-09-18 DIAGNOSIS — I480 Paroxysmal atrial fibrillation: Secondary | ICD-10-CM | POA: Diagnosis not present

## 2022-09-18 DIAGNOSIS — I5042 Chronic combined systolic (congestive) and diastolic (congestive) heart failure: Secondary | ICD-10-CM | POA: Diagnosis not present

## 2022-09-18 DIAGNOSIS — E785 Hyperlipidemia, unspecified: Secondary | ICD-10-CM | POA: Diagnosis not present

## 2022-09-18 DIAGNOSIS — I10 Essential (primary) hypertension: Secondary | ICD-10-CM | POA: Diagnosis not present

## 2022-09-18 DIAGNOSIS — I2510 Atherosclerotic heart disease of native coronary artery without angina pectoris: Secondary | ICD-10-CM | POA: Diagnosis not present

## 2022-09-18 LAB — LIPID PANEL
Chol/HDL Ratio: 2.6 ratio (ref 0.0–4.4)
Cholesterol, Total: 169 mg/dL (ref 100–199)
Triglycerides: 69 mg/dL (ref 0–149)

## 2022-09-19 LAB — BASIC METABOLIC PANEL
BUN/Creatinine Ratio: 21 (ref 12–28)
BUN: 23 mg/dL (ref 8–27)
CO2: 26 mmol/L (ref 20–29)
Calcium: 9.7 mg/dL (ref 8.7–10.3)
Chloride: 103 mmol/L (ref 96–106)
Creatinine, Ser: 1.1 mg/dL — ABNORMAL HIGH (ref 0.57–1.00)
Glucose: 93 mg/dL (ref 70–99)
Potassium: 5 mmol/L (ref 3.5–5.2)
Sodium: 142 mmol/L (ref 134–144)
eGFR: 48 mL/min/{1.73_m2} — ABNORMAL LOW (ref >59)

## 2022-09-19 LAB — LIPID PANEL
HDL: 66 mg/dL (ref >39)
LDL Chol Calc (NIH): 90 mg/dL (ref 0–99)
VLDL Cholesterol Cal: 13 mg/dL (ref 5–40)

## 2022-09-19 LAB — CBC
Hematocrit: 39 % (ref 34.0–46.6)
Hemoglobin: 12.7 g/dL (ref 11.1–15.9)
MCH: 30.4 pg (ref 26.6–33.0)
MCHC: 32.6 g/dL (ref 31.5–35.7)
MCV: 93 fL (ref 79–97)
Platelets: 313 10*3/uL (ref 150–450)
RBC: 4.18 x10E6/uL (ref 3.77–5.28)
RDW: 14.1 % (ref 11.7–15.4)
WBC: 7.1 10*3/uL (ref 3.4–10.8)

## 2022-09-29 ENCOUNTER — Other Ambulatory Visit (HOSPITAL_COMMUNITY)
Admission: RE | Admit: 2022-09-29 | Discharge: 2022-09-29 | Disposition: A | Discharge status: Home / Self Care | Payer: Medicare Other | Source: Ambulatory Visit | Attending: Interventional Radiology | Admitting: Interventional Radiology

## 2022-09-29 ENCOUNTER — Ambulatory Visit
Admission: RE | Admit: 2022-09-29 | Discharge: 2022-09-29 | Disposition: A | Discharge status: Home / Self Care | Payer: Medicare Other | Source: Ambulatory Visit | Attending: Internal Medicine | Admitting: Internal Medicine

## 2022-09-29 DIAGNOSIS — E041 Nontoxic single thyroid nodule: Secondary | ICD-10-CM | POA: Diagnosis not present

## 2022-10-01 LAB — CYTOLOGY - NON PAP

## 2022-10-10 ENCOUNTER — Other Ambulatory Visit: Payer: Self-pay

## 2022-10-10 ENCOUNTER — Encounter: Payer: Self-pay | Admitting: Sports Medicine

## 2022-10-10 ENCOUNTER — Ambulatory Visit (INDEPENDENT_AMBULATORY_CARE_PROVIDER_SITE_OTHER): Payer: Medicare Other | Admitting: Sports Medicine

## 2022-10-10 DIAGNOSIS — M25551 Pain in right hip: Secondary | ICD-10-CM | POA: Diagnosis not present

## 2022-10-10 DIAGNOSIS — M1611 Unilateral primary osteoarthritis, right hip: Secondary | ICD-10-CM

## 2022-10-10 DIAGNOSIS — I5041 Acute combined systolic (congestive) and diastolic (congestive) heart failure: Secondary | ICD-10-CM | POA: Diagnosis not present

## 2022-10-10 MED ORDER — METHYLPREDNISOLONE ACETATE 40 MG/ML IJ SUSP
40.0000 mg | INTRAMUSCULAR | Status: AC | PRN
Start: 2022-10-10 — End: 2022-10-10
  Administered 2022-10-10: 40 mg via INTRA_ARTICULAR

## 2022-10-10 MED ORDER — LIDOCAINE HCL 1 % IJ SOLN
4.0000 mL | INTRAMUSCULAR | Status: AC | PRN
Start: 2022-10-10 — End: 2022-10-10
  Administered 2022-10-10: 4 mL

## 2022-10-10 NOTE — Progress Notes (Signed)
[name] - 89 y.o. female MRN [id number]  Date of birth: 07/29/1931  Office Visit Note: Visit Date: 10/10/2022 PCP: Default, Provider, MD Referred by: No ref. provider found  Subjective: Chief Complaint  Patient presents with   Right Hip - Follow-up   HPI: [name] is a pleasant 89 y.o. female who presents today for acute on chronic right hip pain.  [name] presents today for follow-up of her right hip pain.  She has known advanced end-stage arthritis of the right hip.  Last saw her back in September 2023 and she did have a hip injection which gave her good relief.  Unfortunately in November she was admitted to the hospital with A-fib with RVR.  She has been placed on new cardiology medicine.  She is now also on Eliquis 5 mg daily, cannot take NSAIDs with this.  Having pain over the anterior and lateral hip.  Pertinent ROS were reviewed with the patient and found to be negative unless otherwise specified above in HPI.   Assessment & Plan: Visit Diagnoses:  1. Unilateral primary osteoarthritis, right hip   2. Pain in right hip   3. Acute combined systolic and diastolic heart failure (HCC)    Plan: Ashni has had an exacerbation of her chronic advanced osteoarthritis of the right hip.  She responded well to previous corticosteroid injection, through shared decision making elected to proceed with ultrasound-guided intra-articular hip injection.  She is not a surgical candidate at this time.  She was newly placed on Eliquis, cannot take NSAIDs given this.  May use heat, ice or Tylenol as needed for pain control.  If she continues to get good relief from the injections, we can repeat these infrequently.  Does not sound like a hip replacement is something she wishes to proceed at this time, but we will continue to follow-up to see how she responds.  Follow-up: Return if symptoms worsen or fail to improve.   Meds & Orders: No orders of the defined types were placed in this  encounter.   Orders Placed This Encounter  Procedures   Large Joint Inj   US Guided Needle Placement - No Linked Charges   US Guided Needle Placement - No Linked Charges     Procedures: Large Joint Inj: R hip joint on 10/10/2022 3:34 PM Indications: pain Details: 22 G 3.5 in needle, ultrasound-guided anterior approach Medications: 4 mL lidocaine 1 %; 40 mg methylPREDNISolone acetate 40 MG/ML Outcome: tolerated well, no immediate complications  Procedure: US-guided intra-articular hip injection, right After discussion on risks/benefits/indications and informed verbal consent was obtained, a timeout was performed. Patient was lying supine on exam table. The hip was cleaned with betadine and alcohol swabs. Then utilizing ultrasound guidance, the patient's femoral head and neck junction was identified and subsequently injected with 4:1 lidocaine:depomedrol via an in-plane approach with ultrasound visualization of the injectate administered into the hip joint. Patient tolerated procedure well without immediate complications.  Procedure, treatment alternatives, risks and benefits explained, specific risks discussed. Consent was given by the patient. Immediately prior to procedure a time out was called to verify the correct patient, procedure, equipment, support staff and site/side marked as required. Patient was prepped and draped in the usual sterile fashion.          Clinical History: No specialty comments available.  She reports that she has quit smoking. Her smoking use included cigarettes. She has never used smokeless tobacco. No results for input(s): "HGBA1C", "LABURIC" in the last 8760  hours.  Objective:   Vital Signs: There were no vitals taken for this visit.  Physical Exam  Gen: Well-appearing, in no acute distress; non-toxic CV:  Well-perfused. Warm.  Resp: Breathing unlabored on room air; no wheezing. Psych: Fluid speech in conversation; appropriate affect; normal thought  process Neuro: Sensation intact throughout. No gross coordination deficits.   Ortho Exam - Right hip: Patient walks with the assistance of a cane.  Evaluation of the right hip demonstrates no redness or bruising.  There is a small effusion on the hip noted on ultrasound.  There is limited internal and external rotation with some crepitus.  Imaging:  XR R-hip 01/09/22: 2 views of the right hip including AP pelvis and lateral view were ordered  and reviewed by myself.  X-rays demonstrate severe end-stage bone-on-bone  osteoarthritis of the right hip.  There is significant bony spurring  formation as well as a subchondral cyst within the superior lateral aspect  of the femoral head.  Single view of the contralateral hip shows at least  moderate to severe OA as well.  No acute fracture noted.   Past Medical/Family/Surgical/Social History: Medications & Allergies reviewed per EMR, new medications updated. Patient Active Problem List   Diagnosis Date Noted   Acute combined systolic and diastolic heart failure (HCC) 02/22/2022   Morbid obesity (HCC) 02/21/2022   Atrial fibrillation with RVR (HCC) 02/20/2022   Elevated troponin 02/20/2022   Ischemic cardiomyopathy 02/20/2022   NSTEMI (non-ST elevated myocardial infarction) (HCC) 02/19/2022   History reviewed. No pertinent past medical history. History reviewed. No pertinent family history. Past Surgical History:  Procedure Laterality Date   LEFT HEART CATH AND CORONARY ANGIOGRAPHY N/A 02/20/2022   Procedure: LEFT HEART CATH AND CORONARY ANGIOGRAPHY;  Surgeon: Lennette Bihari, MD;  Location: [facility];  Service: Cardiovascular;  Laterality: N/A;   Social History   Occupational History   Occupation: Retired  Tobacco Use   Smoking status: Former    Years: 3    Types: Cigarettes   Smokeless tobacco: Never  Building services engineer Use: Never used  Substance and Sexual Activity   Alcohol use: Never   Drug use: Never   Sexual  activity: Not on file   I spent 37 minutes in the care of the patient today including face-to-face time, preparation to see the patient, as well as review of previous hip x-ray from 01/09/2022, discussion with Corrie Dandy on conservative versus injection therapy, review of recent hospitalization and cardiology notes as she was placed on medication, treatment options for for the above diagnoses.   Madelyn Brunner, DO Primary Care Sports Medicine Physician  [facility] - Orthopedics  This note was dictated using Dragon naturally speaking software and may contain errors in syntax, spelling, or content which have not been identified prior to signing this note.

## 2022-10-14 DIAGNOSIS — Z961 Presence of intraocular lens: Secondary | ICD-10-CM | POA: Diagnosis not present

## 2022-10-14 DIAGNOSIS — H3023 Posterior cyclitis, bilateral: Secondary | ICD-10-CM | POA: Diagnosis not present

## 2022-10-14 DIAGNOSIS — H35033 Hypertensive retinopathy, bilateral: Secondary | ICD-10-CM | POA: Diagnosis not present

## 2022-10-14 DIAGNOSIS — Z79899 Other long term (current) drug therapy: Secondary | ICD-10-CM | POA: Diagnosis not present

## 2022-10-15 DIAGNOSIS — Z79899 Other long term (current) drug therapy: Secondary | ICD-10-CM | POA: Diagnosis not present

## 2022-10-15 DIAGNOSIS — H3023 Posterior cyclitis, bilateral: Secondary | ICD-10-CM | POA: Diagnosis not present

## 2023-01-21 ENCOUNTER — Ambulatory Visit: Payer: Medicare Other | Admitting: Cardiology

## 2023-02-10 DIAGNOSIS — E041 Nontoxic single thyroid nodule: Secondary | ICD-10-CM | POA: Diagnosis not present

## 2023-02-10 DIAGNOSIS — Z0189 Encounter for other specified special examinations: Secondary | ICD-10-CM | POA: Diagnosis not present

## 2023-02-10 DIAGNOSIS — N183 Chronic kidney disease, stage 3 unspecified: Secondary | ICD-10-CM | POA: Diagnosis not present

## 2023-02-10 DIAGNOSIS — E039 Hypothyroidism, unspecified: Secondary | ICD-10-CM | POA: Diagnosis not present

## 2023-02-10 DIAGNOSIS — I10 Essential (primary) hypertension: Secondary | ICD-10-CM | POA: Diagnosis not present

## 2023-02-10 DIAGNOSIS — E785 Hyperlipidemia, unspecified: Secondary | ICD-10-CM | POA: Diagnosis not present

## 2023-02-10 DIAGNOSIS — Z0000 Encounter for general adult medical examination without abnormal findings: Secondary | ICD-10-CM | POA: Diagnosis not present

## 2023-02-13 DIAGNOSIS — Z23 Encounter for immunization: Secondary | ICD-10-CM | POA: Diagnosis not present

## 2023-02-17 DIAGNOSIS — I10 Essential (primary) hypertension: Secondary | ICD-10-CM | POA: Diagnosis not present

## 2023-02-17 DIAGNOSIS — E785 Hyperlipidemia, unspecified: Secondary | ICD-10-CM | POA: Diagnosis not present

## 2023-02-17 DIAGNOSIS — L989 Disorder of the skin and subcutaneous tissue, unspecified: Secondary | ICD-10-CM | POA: Diagnosis not present

## 2023-02-17 DIAGNOSIS — Z961 Presence of intraocular lens: Secondary | ICD-10-CM | POA: Diagnosis not present

## 2023-02-17 DIAGNOSIS — G8929 Other chronic pain: Secondary | ICD-10-CM | POA: Diagnosis not present

## 2023-02-17 DIAGNOSIS — R82998 Other abnormal findings in urine: Secondary | ICD-10-CM | POA: Diagnosis not present

## 2023-02-17 DIAGNOSIS — H3530 Unspecified macular degeneration: Secondary | ICD-10-CM | POA: Diagnosis not present

## 2023-02-17 DIAGNOSIS — M25551 Pain in right hip: Secondary | ICD-10-CM | POA: Diagnosis not present

## 2023-02-17 DIAGNOSIS — H35033 Hypertensive retinopathy, bilateral: Secondary | ICD-10-CM | POA: Diagnosis not present

## 2023-02-17 DIAGNOSIS — H3023 Posterior cyclitis, bilateral: Secondary | ICD-10-CM | POA: Diagnosis not present

## 2023-02-17 DIAGNOSIS — E039 Hypothyroidism, unspecified: Secondary | ICD-10-CM | POA: Diagnosis not present

## 2023-02-17 DIAGNOSIS — Z79899 Other long term (current) drug therapy: Secondary | ICD-10-CM | POA: Diagnosis not present

## 2023-02-17 DIAGNOSIS — Z0000 Encounter for general adult medical examination without abnormal findings: Secondary | ICD-10-CM | POA: Diagnosis not present

## 2023-02-20 DIAGNOSIS — Z79899 Other long term (current) drug therapy: Secondary | ICD-10-CM | POA: Diagnosis not present

## 2023-02-20 DIAGNOSIS — H3023 Posterior cyclitis, bilateral: Secondary | ICD-10-CM | POA: Diagnosis not present

## 2023-02-26 ENCOUNTER — Encounter: Payer: Self-pay | Admitting: Sports Medicine

## 2023-02-26 ENCOUNTER — Ambulatory Visit: Payer: Medicare Other | Admitting: Sports Medicine

## 2023-02-26 ENCOUNTER — Other Ambulatory Visit: Payer: Self-pay

## 2023-02-26 DIAGNOSIS — M25551 Pain in right hip: Secondary | ICD-10-CM | POA: Diagnosis not present

## 2023-02-26 DIAGNOSIS — M1611 Unilateral primary osteoarthritis, right hip: Secondary | ICD-10-CM

## 2023-02-26 DIAGNOSIS — G8929 Other chronic pain: Secondary | ICD-10-CM | POA: Diagnosis not present

## 2023-02-26 MED ORDER — METHYLPREDNISOLONE ACETATE 40 MG/ML IJ SUSP
40.0000 mg | INTRAMUSCULAR | Status: AC | PRN
  Administered 2023-02-26: 40 mg via INTRA_ARTICULAR

## 2023-02-26 MED ORDER — LIDOCAINE HCL 1 % IJ SOLN
4.0000 mL | INTRAMUSCULAR | Status: AC | PRN
  Administered 2023-02-26: 4 mL

## 2023-02-26 NOTE — Progress Notes (Signed)
[name] - 89 y.o. female MRN [id number]  Date of birth: 1931-05-07  Office Visit Note: Visit Date: 02/26/2023 PCP: Default, Provider, MD Referred by: No ref. provider found  Subjective: Chief Complaint  Patient presents with   Right Hip - Pain    Injection   HPI: [name] is a pleasant 89 y.o. female who presents today for acute on chronic right hip pain.  She does have advanced bone-on-bone arthritic change of the right hip.  We gave her an injection over 1 year ago into the right hip that gave her rather great relief.  She is interested in repeating this today.  She is not taking any medication for pain.  She is on quite a bit of cardiology based medication based on her prior heart attack.  Pertinent ROS were reviewed with the patient and found to be negative unless otherwise specified above in HPI.   Assessment & Plan: Visit Diagnoses:  1. Unilateral primary osteoarthritis, right hip   2. Chronic pain of right hip    Plan: Impression is exacerbation of chronic severe bone-on-bone osteoarthritis of the right hip.  She has responded well in the past to corticosteroid injections, through shared decision making we did proceed with ultrasound-guided intra-articular hip injection.  He is not interested in surgical replacement.  She may use ice, heat or Tylenol as needed for pain control.  Did discuss postinjection protocol, after 48 to 72 hours she may return to activity without restriction.  We can repeat these infrequently, she will call me if she desires this anywhere greater than 3 months.  Follow-up: Return if symptoms worsen or fail to improve.   Meds & Orders: No orders of the defined types were placed in this encounter.   Orders Placed This Encounter  Procedures   Large Joint Inj   US Guided Needle Placement - No Linked Charges     Procedures: Large Joint Inj: R hip joint on 02/26/2023 4:26 PM Indications: pain Details: 22 G 3.5 in needle, ultrasound-guided  anterior approach Medications: 4 mL lidocaine 1 %; 40 mg methylPREDNISolone acetate 40 MG/ML Outcome: tolerated well, no immediate complications  Procedure: US-guided intra-articular hip injection, Right After discussion on risks/benefits/indications and informed verbal consent was obtained, a timeout was performed. Patient was lying supine on exam table. The hip was cleaned with betadine and alcohol swabs. Then utilizing ultrasound guidance, the patient's femoral head and neck junction was identified and subsequently injected with 4:1 lidocaine:depomedrol via an in-plane approach with ultrasound visualization of the injectate administered into the hip joint. Patient tolerated procedure well without immediate complications.  Procedure, treatment alternatives, risks and benefits explained, specific risks discussed. Consent was given by the patient. Immediately prior to procedure a time out was called to verify the correct patient, procedure, equipment, support staff and site/side marked as required. Patient was prepped and draped in the usual sterile fashion.          Clinical History: No specialty comments available.  She reports that she has quit smoking. Her smoking use included cigarettes. She has never used smokeless tobacco. No results for input(s): "HGBA1C", "LABURIC" in the last 8760 hours.  Objective:    Physical Exam  Gen: Well-appearing, in no acute distress; non-toxic CV: Well-perfused. Warm.  Resp: Breathing unlabored on room air; no wheezing. Psych: Fluid speech in conversation; appropriate affect; normal thought process Neuro: Sensation intact throughout. No gross coordination deficits.   Ortho Exam - Right hip: No effusion, redness or warmth to  the hip.  There is extremely limited range of motion in all directions with some mild grating.  She walks with the assistance of a cane for support.  Imaging:  Hip XR: 01/09/22: 2 views of the right hip including AP pelvis and  lateral view were ordered  and reviewed by myself.  X-rays demonstrate severe end-stage bone-on-bone  osteoarthritis of the right hip.  There is significant bony spurring  formation as well as a subchondral cyst within the superior lateral aspect  of the femoral head.  Single view of the contralateral hip shows at least  moderate to severe OA as well.  No acute fracture noted.   Past Medical/Family/Surgical/Social History: Medications & Allergies reviewed per EMR, new medications updated. Patient Active Problem List   Diagnosis Date Noted   Acute combined systolic and diastolic heart failure (HCC) 02/22/2022   Morbid obesity (HCC) 02/21/2022   Atrial fibrillation with RVR (HCC) 02/20/2022   Elevated troponin 02/20/2022   Ischemic cardiomyopathy 02/20/2022   NSTEMI (non-ST elevated myocardial infarction) (HCC) 02/19/2022   No past medical history on file. No family history on file. Past Surgical History:  Procedure Laterality Date   LEFT HEART CATH AND CORONARY ANGIOGRAPHY N/A 02/20/2022   Procedure: LEFT HEART CATH AND CORONARY ANGIOGRAPHY;  Surgeon: Lennette Bihari, MD;  Location: [facility];  Service: Cardiovascular;  Laterality: N/A;   Social History   Occupational History   Occupation: Retired  Tobacco Use   Smoking status: Former    Types: Cigarettes   Smokeless tobacco: Never  Vaping Use   Vaping status: Never Used  Substance and Sexual Activity   Alcohol use: Never   Drug use: Never   Sexual activity: Not on file

## 2023-06-23 DIAGNOSIS — Z961 Presence of intraocular lens: Secondary | ICD-10-CM | POA: Diagnosis not present

## 2023-06-23 DIAGNOSIS — Z79899 Other long term (current) drug therapy: Secondary | ICD-10-CM | POA: Diagnosis not present

## 2023-06-23 DIAGNOSIS — H35033 Hypertensive retinopathy, bilateral: Secondary | ICD-10-CM | POA: Diagnosis not present

## 2023-06-23 DIAGNOSIS — H3023 Posterior cyclitis, bilateral: Secondary | ICD-10-CM | POA: Diagnosis not present

## 2023-07-09 ENCOUNTER — Encounter: Payer: Self-pay | Admitting: Sports Medicine

## 2023-07-09 ENCOUNTER — Ambulatory Visit: Admitting: Sports Medicine

## 2023-07-09 ENCOUNTER — Other Ambulatory Visit: Payer: Self-pay

## 2023-07-09 DIAGNOSIS — M25451 Effusion, right hip: Secondary | ICD-10-CM | POA: Diagnosis not present

## 2023-07-09 DIAGNOSIS — G8929 Other chronic pain: Secondary | ICD-10-CM | POA: Diagnosis not present

## 2023-07-09 DIAGNOSIS — M1611 Unilateral primary osteoarthritis, right hip: Secondary | ICD-10-CM | POA: Diagnosis not present

## 2023-07-09 DIAGNOSIS — M25551 Pain in right hip: Secondary | ICD-10-CM

## 2023-07-09 MED ORDER — METHYLPREDNISOLONE ACETATE 40 MG/ML IJ SUSP
40.0000 mg | INTRAMUSCULAR | Status: AC | PRN
  Administered 2023-07-09: 40 mg via INTRA_ARTICULAR

## 2023-07-09 NOTE — Progress Notes (Signed)
 Patient says that her last hip injection worked well. She had good relief until about 2 weeks ago.

## 2023-07-09 NOTE — Progress Notes (Signed)
 Office Visit Note   Patient: [name]           Date of Birth: 1931/12/11           MRN: [id number] Visit Date: 07/09/2023              Requested by: No referring provider defined for this encounter. PCP: Default, Provider, MD  Medical Resident, Sports Medicine Fellow - Attending Physician Addendum:   I have independently interviewed and examined the patient myself. I have discussed the above with the original author and agree with their documentation. My edits for correction/addition/clarification have been made, see any changes above and below.   In summary, a very pleasant 89 year old female who presents for acute on chronic right hip pain with known severe bone-on-bone osteoarthritis of the right hip.  She has received good relief for at least 3-4 months from prior hip injections.  She is interested in repeating this as she did have good pain relief until the last 2 weeks in which pain became reexacerbated.  Through shared decision-making, we did proceed with ultrasound-guided right hip aspiration of about 8.5 cc with subsequent injection, patient tolerated well and had good relief following this procedure.  She may use ice/heat or Tylenol for any postinjection pain.  She will follow-up with [place] as needed.  Madelyn Brunner, DO Primary Care Sports Medicine Physician  [facility] - Orthopedics  Assessment & Plan: Visit Diagnoses:  1. Unilateral primary osteoarthritis, right hip   2. Chronic pain of right hip   3. Right hip joint effusion    Plan: Patient presenting for follow-up of her right hip pain.  Patient tolerated previous procedure well and had benefit, we will go ahead and do repeat injections today.  Patient was noted to have some effusion of the hip joint on ultrasound, patient had approximately 8 cc aspirated from the hip joint and the injection was placed into the joint space.  Patient tolerated procedure well and without any difficulty.  Patient was advised to  follow-up as needed for her pain.  Patient is understanding agreeable plan.  Follow-Up Instructions: Return if symptoms worsen or fail to improve.   Orders:  Orders Placed This Encounter  Procedures   Large Joint Inj: R hip joint   US Guided Needle Placement - No Linked Charges   No orders of the defined types were placed in this encounter.    Procedures: Large Joint Inj: R hip joint on 07/09/2023 2:08 PM Indications: pain Details: 22 G 3.5 in needle, ultrasound-guided anterior approach Medications: 40 mg methylPREDNISolone acetate 40 MG/ML Outcome: tolerated well, no immediate complications  Procedure: US-guided intra-articular hip injection, Right After discussion on risks/benefits/indications and informed verbal consent was obtained, a timeout was performed. Patient was lying supine on exam table. The hip was cleaned with betadine and alcohol swabs. Then utilizing ultrasound guidance, the patient's femoral head and neck junction was identified and Needle was placed in the joint, subsequently 8.5 cc of serosanguineous fluid was removed.  Under sterile conditions, needle was replaced and subsequently injected with 4:1 lidocaine:depomedrol via an in-plane approach with ultrasound visualization of the injectate administered into the hip joint. Patient tolerated procedure well without immediate complications.  Procedure, treatment alternatives, risks and benefits explained, specific risks discussed. Consent was given by the patient and spouse. Immediately prior to procedure a time out was called to verify the correct patient, procedure, equipment, support staff and site/side marked as required. Patient was prepped and draped in the  usual sterile fashion.      Clinical Data: No additional findings.   Subjective: Chief Complaint  Patient presents with   Right Hip - Pain    Injection    Patient is presenting for follow-up of her right hip pain.  Patient notes significant improvement  after the previous injection but does note that lately she has been having more pain recently.  Patient states that she feels like she is saying the word "OW" more recently and she said that that was her clue that she needs to come back into try and get a repeat injection.  Patient is still active and is able to walk around her house without any cane but does note that she uses a cane whenever she goes outside.  Patient's pain is located in the groin of the right hip still.   Review of Systems   Objective:  Physical Exam  Ortho Exam Inspection reveals no gross abnormality of the right hip.  Passive logroll is decreased due to stiffness.  Range of motion of the hip is also decreased with hip flexion due to stiffness.  FADIR is positive.  Range of motion is decreased.  Strength is decreased due to pain.  Imaging:  01/09/22: 2 views of the right hip including AP pelvis and lateral view were ordered  and reviewed by myself.  X-rays demonstrate severe end-stage bone-on-bone  osteoarthritis of the right hip.  There is significant bony spurring  formation as well as a subchondral cyst within the superior lateral aspect  of the femoral head.  Single view of the contralateral hip shows at least  moderate to severe OA as well.  No acute fracture noted.   PMFS History: Patient Active Problem List   Diagnosis Date Noted   Acute combined systolic and diastolic heart failure (HCC) 02/22/2022   Morbid obesity (HCC) 02/21/2022   Atrial fibrillation with RVR (HCC) 02/20/2022   Elevated troponin 02/20/2022   Ischemic cardiomyopathy 02/20/2022   NSTEMI (non-ST elevated myocardial infarction) (HCC) 02/19/2022   History reviewed. No pertinent past medical history.  History reviewed. No pertinent family history.  Past Surgical History:  Procedure Laterality Date   LEFT HEART CATH AND CORONARY ANGIOGRAPHY N/A 02/20/2022   Procedure: LEFT HEART CATH AND CORONARY ANGIOGRAPHY;  Surgeon: Lennette Bihari, MD;   Location: [facility];  Service: Cardiovascular;  Laterality: N/A;   Social History   Occupational History   Occupation: Retired  Tobacco Use   Smoking status: Former    Types: Cigarettes   Smokeless tobacco: Never  Vaping Use   Vaping status: Never Used  Substance and Sexual Activity   Alcohol use: Never   Drug use: Never   Sexual activity: Not on file

## 2023-11-17 DIAGNOSIS — Z961 Presence of intraocular lens: Secondary | ICD-10-CM | POA: Diagnosis not present

## 2023-11-17 DIAGNOSIS — Z79899 Other long term (current) drug therapy: Secondary | ICD-10-CM | POA: Diagnosis not present

## 2023-11-17 DIAGNOSIS — H35033 Hypertensive retinopathy, bilateral: Secondary | ICD-10-CM | POA: Diagnosis not present

## 2023-11-17 DIAGNOSIS — H3023 Posterior cyclitis, bilateral: Secondary | ICD-10-CM | POA: Diagnosis not present

## 2023-11-18 ENCOUNTER — Other Ambulatory Visit: Payer: Self-pay

## 2023-11-18 ENCOUNTER — Ambulatory Visit (INDEPENDENT_AMBULATORY_CARE_PROVIDER_SITE_OTHER): Admitting: Sports Medicine

## 2023-11-18 DIAGNOSIS — M25551 Pain in right hip: Secondary | ICD-10-CM | POA: Diagnosis not present

## 2023-11-18 DIAGNOSIS — M1611 Unilateral primary osteoarthritis, right hip: Secondary | ICD-10-CM | POA: Diagnosis not present

## 2023-11-18 DIAGNOSIS — G8929 Other chronic pain: Secondary | ICD-10-CM

## 2023-11-18 DIAGNOSIS — M25451 Effusion, right hip: Secondary | ICD-10-CM

## 2023-11-18 MED ORDER — LIDOCAINE HCL 1 % IJ SOLN
4.0000 mL | INTRAMUSCULAR | Status: AC | PRN
  Administered 2023-11-18: 4 mL

## 2023-11-18 MED ORDER — METHYLPREDNISOLONE ACETATE 40 MG/ML IJ SUSP
40.0000 mg | INTRAMUSCULAR | Status: AC | PRN
  Administered 2023-11-18: 40 mg via INTRA_ARTICULAR

## 2023-11-18 NOTE — Progress Notes (Signed)
 Patient says that she got great relief from her injection in March. She says that her hip felt good until her pain returned about 3 weeks ago. Her pain does feel the same as it did prior to the injection. She denies any new injuries in the time since her last visit.

## 2023-11-18 NOTE — Progress Notes (Signed)
 [name] - 89 y.o. female MRN [id number]  Date of birth: 03/01/1932  Office Visit Note: Visit Date: 11/18/2023 PCP: Default, Provider, MD Referred by: No ref. provider found  Subjective: Chief Complaint  Patient presents with   Right Hip - Pain   HPI: [name] is a pleasant 89 y.o. female who presents today for acute on chronic right hip pain with advanced arthritis.  We last saw [facility] back in March where we performed ultrasound-guided hip aspiration and subsequent injection.  This gave her significant relief of her pain and the hip was feeling quite well until the past few weeks.  She does have to lift the hip up getting in and out of her car and transferring.  Pain is deep within the hip joint.  She does not take much medication for pain control.  Pertinent ROS were reviewed with the patient and found to be negative unless otherwise specified above in HPI.   Assessment & Plan: Visit Diagnoses:  1. Unilateral primary osteoarthritis, right hip   2. Chronic pain of right hip   3. Right hip joint effusion    Plan: Impression is acute exacerbation of right hip severe osteoarthritis, ultrasound does show a concomitant hip effusion.  Through shared decision making, we did proceed with ultrasound-guided right hip aspiration yielding 7.5 cc of yellow fluid, followed by intra-articular hip injection.  Patient tolerated well, was advised on postinjection protocol.  May use ice/heat and/or Tylenol  for any pain control.  Did discuss the infrequent nature of injections greater than at least 3 months apart.  She is not interested in hip replacement at this time.  Follow-up as needed.  Follow-up: Return in about 3 months (around 02/18/2024), or if symptoms worsen or fail to improve.   Meds & Orders: No orders of the defined types were placed in this encounter.   Orders Placed This Encounter  Procedures   Large Joint Inj   US  Guided Needle Placement - No Linked Charges      Procedures: Large Joint Inj: R hip joint on 11/18/2023 9:45 AM Indications: pain Details: 22 G 3.5 in needle, ultrasound-guided anterior approach Medications: 4 mL lidocaine  1 %; 40 mg methylPREDNISolone  acetate 40 MG/ML Aspirate: 7.5 mL clear and yellow Outcome: tolerated well, no immediate complications  Procedure: US -guided intra-articular hip injection, Right After discussion on risks/benefits/indications and informed verbal consent was obtained, a timeout was performed. Patient was lying supine on exam table. The hip was cleaned with chloraprep and alcohol  swabs. Then utilizing ultrasound guidance, the patient's femoral head and neck junction was identified and a hip effusion was identified, this was subsequently aspirated with approximately 7.5cc of clear, yellow fluid. Then, performing sterile syringe swab utilizting the same portal, the hip joint was subsequently injected with 4:1 lidocaine :depomedrol via an in-plane approach with ultrasound visualization of the injectate administered into the hip joint. Patient tolerated procedure well without immediate complications.  Procedure, treatment alternatives, risks and benefits explained, specific risks discussed. Consent was given by the patient. Immediately prior to procedure a time out was called to verify the correct patient, procedure, equipment, support staff and site/side marked as required. Patient was prepped and draped in the usual sterile fashion.          Clinical History: No specialty comments available.  She reports that she has quit smoking. Her smoking use included cigarettes. She has never used smokeless tobacco. No results for input(s): HGBA1C, LABURIC in the last 8760 hours.  Objective:  Physical Exam  Gen: Well-appearing, in no acute distress; non-toxic CV: Well-perfused. Warm.  Resp: Breathing unlabored on room air; no wheezing. Psych: Fluid speech in conversation; appropriate affect; normal thought  process  Ortho Exam - Right hip: There is a small to moderate effusion about the right hip joint without warmth or redness.  There is significant limited range of motion in all directions with crepitus.  Positive Stinchfield.  Imaging:  01/09/22: 2 views of the right hip including AP pelvis and lateral view were ordered  and reviewed by myself.  X-rays demonstrate severe end-stage bone-on-bone  osteoarthritis of the right hip.  There is significant bony spurring  formation as well as a subchondral cyst within the superior lateral aspect  of the femoral head.  Single view of the contralateral hip shows at least  moderate to severe OA as well.  No acute fracture noted.   Past Medical/Family/Surgical/Social History: Medications & Allergies reviewed per EMR, new medications updated. Patient Active Problem List   Diagnosis Date Noted   Acute combined systolic and diastolic heart failure (HCC) 02/22/2022   Morbid obesity (HCC) 02/21/2022   Atrial fibrillation with RVR (HCC) 02/20/2022   Elevated troponin 02/20/2022   Ischemic cardiomyopathy 02/20/2022   NSTEMI (non-ST elevated myocardial infarction) (HCC) 02/19/2022   No past medical history on file. No family history on file. Past Surgical History:  Procedure Laterality Date   LEFT HEART CATH AND CORONARY ANGIOGRAPHY N/A 02/20/2022   Procedure: LEFT HEART CATH AND CORONARY ANGIOGRAPHY;  Surgeon: Burnard Debby LABOR, MD;  Location: [facility];  Service: Cardiovascular;  Laterality: N/A;   Social History   Occupational History   Occupation: Retired  Tobacco Use   Smoking status: Former    Types: Cigarettes   Smokeless tobacco: Never  Vaping Use   Vaping status: Never Used  Substance and Sexual Activity   Alcohol  use: Never   Drug use: Never   Sexual activity: Not on file

## 2024-02-03 ENCOUNTER — Telehealth: Payer: Self-pay | Admitting: Sports Medicine

## 2024-02-03 NOTE — Telephone Encounter (Signed)
 Pt called wanting cortisone shot in rt hip. Call back number is [phone]

## 2024-02-04 NOTE — Telephone Encounter (Signed)
 Patient scheduled 02/22/2024 at 2:00pm. Will call to reschedule if that no longer works.

## 2024-02-05 DIAGNOSIS — Z23 Encounter for immunization: Secondary | ICD-10-CM | POA: Diagnosis not present

## 2024-02-22 ENCOUNTER — Other Ambulatory Visit: Payer: Self-pay

## 2024-02-22 ENCOUNTER — Encounter: Payer: Self-pay | Admitting: Sports Medicine

## 2024-02-22 ENCOUNTER — Encounter: Payer: Self-pay | Admitting: Radiology

## 2024-02-22 ENCOUNTER — Ambulatory Visit: Admitting: Sports Medicine

## 2024-02-22 DIAGNOSIS — G8929 Other chronic pain: Secondary | ICD-10-CM | POA: Diagnosis not present

## 2024-02-22 DIAGNOSIS — M25551 Pain in right hip: Secondary | ICD-10-CM | POA: Diagnosis not present

## 2024-02-22 DIAGNOSIS — M1611 Unilateral primary osteoarthritis, right hip: Secondary | ICD-10-CM | POA: Diagnosis not present

## 2024-02-22 DIAGNOSIS — M25451 Effusion, right hip: Secondary | ICD-10-CM

## 2024-02-22 MED ORDER — LIDOCAINE HCL 1 % IJ SOLN
4.0000 mL | INTRAMUSCULAR | Status: AC | PRN
  Administered 2024-02-22: 4 mL

## 2024-02-22 MED ORDER — METHYLPREDNISOLONE ACETATE 40 MG/ML IJ SUSP
40.0000 mg | INTRAMUSCULAR | Status: AC | PRN
  Administered 2024-02-22: 40 mg via INTRA_ARTICULAR

## 2024-02-22 NOTE — Progress Notes (Signed)
 Patient says that she did well with the last injection. She had great relief until a couple of weeks ago; her pain has gradually returned over the last couple of weeks. She says that it does feel similar to the way that it has felt in the past.

## 2024-02-22 NOTE — Progress Notes (Signed)
 [name] - 89 y.o. female MRN [id number]  Date of birth: [birth year]  Office Visit Note: Visit Date: 02/22/2024 PCP: Default, Provider, MD Referred by: No ref. provider found  Subjective: Chief Complaint  Patient presents with   Right Hip - Pain   HPI: [name] is a pleasant 89 y.o. female who presents today for acute on chronic right hip pain with known advanced osteoarthritis.  We saw [name] back in July and she was having hip pain.  She did have an effusion of the hip at that point and we did aspirate about 8 cc of fluid off and provided subsequent injection.  This gave her good relief until recently when her hip began bothering her again.  She feels the pain deep within the hip joint has trouble lifting the hip with transferring or getting in/out of the car.  Pertinent ROS were reviewed with the patient and found to be negative unless otherwise specified above in HPI.   Assessment & Plan: Visit Diagnoses:  1. Unilateral primary osteoarthritis, right hip   2. Chronic pain of right hip   3. Right hip joint effusion    Plan: Impression is acute exacerbation of chronic severe right hip osteoarthritis with subsequent hip effusion.  She is a nonsurgical candidate, she has received at least 2 to 3 months of benefit from previous injection, the last was back in July.  Through shared decision making, we did proceed with ultrasound-guided hip aspiration and subsequent corticosteroid injection.  Patient tolerated well, advised on postinjection protocol.  May use ice/heat and/or Tylenol  as needed.  I would like to see her back sometime over the next 3-4 months and at that point repeat hip x-rays given it has been in 2 years.  Will discuss additional treatment options at that time as well.  Follow-up: Return in about 3 months (around 05/24/2024) for R-hip f/u -> repeat XR's   Meds & Orders: No orders of the defined types were placed in this encounter.   Orders Placed This Encounter   Procedures   Large Joint Inj   US  Guided Needle Placement - No Linked Charges     Procedures: Large Joint Inj: R hip joint on 02/22/2024 2:23 PM Indications: pain Details: 22 G 3.5 in needle, ultrasound-guided anterior approach Medications: 4 mL lidocaine  1 %; 40 mg methylPREDNISolone  acetate 40 MG/ML Aspirate: 5 mL clear and yellow Outcome: tolerated well, no immediate complications  Procedure: US -guided intra-articular hip injection, Right After discussion on risks/benefits/indications and informed verbal consent was obtained, a timeout was performed. Patient was lying supine on exam table. The hip was cleaned with chloraprep and alcohol  swabs. Then utilizing ultrasound guidance, the patient's femoral head and neck junction was identified and a mild to moderate hip effusion was present.  Using a 22-gauge, 3.5 inch needle, ultrasound guidance in-plane was used with needle entrance into the hip joint, approximately 5 cc of clear, yellow fluid was aspirated, following this using a sterile needle swab the hip joint was subsequently injected with 4:1 lidocaine :depomedrol via an in-plane approach with ultrasound visualization of the injectate administered into the hip joint. Patient tolerated procedure well without immediate complications.   Procedure, treatment alternatives, risks and benefits explained, specific risks discussed. Consent was given by the patient. Immediately prior to procedure a time out was called to verify the correct patient, procedure, equipment, support staff and site/side marked as required. Patient was prepped and draped in the usual sterile fashion.  Clinical History: No specialty comments available.  She reports that she has quit smoking. Her smoking use included cigarettes. She has never used smokeless tobacco. No results for input(s): HGBA1C, LABURIC in the last 8760 hours.  Objective:    Physical Exam  Gen: Well-appearing, in no acute distress;  non-toxic CV: Well-perfused. Warm.  Resp: Breathing unlabored on room air; no wheezing. Psych: Fluid speech in conversation; appropriate affect; normal thought process  Ortho Exam - Right hip: There is a mild effusion palpable on the hip.  No greater trochanteric TTP.  There is limited motion in all planes, and mild hip flexion contracture present on the right side.  Patient does ambulate with the use of a cane.  Imaging:  *Previous imaging 01/09/22: 2 views of the right hip including AP pelvis and lateral view were ordered  and reviewed by myself.  X-rays demonstrate severe end-stage bone-on-bone  osteoarthritis of the right hip.  There is significant bony spurring  formation as well as a subchondral cyst within the superior lateral aspect  of the femoral head.  Single view of the contralateral hip shows at least  moderate to severe OA as well.  No acute fracture noted.   Past Medical/Family/Surgical/Social History: Medications & Allergies reviewed per EMR, new medications updated. Patient Active Problem List   Diagnosis Date Noted   Acute combined systolic and diastolic heart failure (HCC) 02/22/2022   Morbid obesity (HCC) 02/21/2022   Atrial fibrillation with RVR (HCC) 02/20/2022   Elevated troponin 02/20/2022   Ischemic cardiomyopathy 02/20/2022   NSTEMI (non-ST elevated myocardial infarction) (HCC) 02/19/2022   History reviewed. No pertinent past medical history. History reviewed. No pertinent family history. Past Surgical History:  Procedure Laterality Date   LEFT HEART CATH AND CORONARY ANGIOGRAPHY N/A 02/20/2022   Procedure: LEFT HEART CATH AND CORONARY ANGIOGRAPHY;  Surgeon: Burnard Debby LABOR, MD;  Location: [facility];  Service: Cardiovascular;  Laterality: N/A;   Social History   Occupational History   Occupation: Retired  Tobacco Use   Smoking status: Former    Types: Cigarettes   Smokeless tobacco: Never  Vaping Use   Vaping status: Never Used  Substance  and Sexual Activity   Alcohol  use: Never   Drug use: Never   Sexual activity: Not on file

## 2024-03-14 DIAGNOSIS — R82998 Other abnormal findings in urine: Secondary | ICD-10-CM | POA: Diagnosis not present

## 2024-03-22 DIAGNOSIS — H3023 Posterior cyclitis, bilateral: Secondary | ICD-10-CM | POA: Diagnosis not present

## 2024-03-22 DIAGNOSIS — Z961 Presence of intraocular lens: Secondary | ICD-10-CM | POA: Diagnosis not present

## 2024-03-22 DIAGNOSIS — Z79899 Other long term (current) drug therapy: Secondary | ICD-10-CM | POA: Diagnosis not present

## 2024-03-22 DIAGNOSIS — H35033 Hypertensive retinopathy, bilateral: Secondary | ICD-10-CM | POA: Diagnosis not present

## 2024-05-24 ENCOUNTER — Ambulatory Visit: Admitting: Sports Medicine

## 2024-06-10 ENCOUNTER — Ambulatory Visit: Admitting: Sports Medicine
